# Patient Record
Sex: Male | Born: 1995 | Race: White | Hispanic: No | Smoking: Former smoker
Health system: Southern US, Community
[De-identification: ages and names within clinical notes are randomized; demographics above are authoritative.]

## PROBLEM LIST (undated history)

## (undated) DIAGNOSIS — I10 Essential (primary) hypertension: Secondary | ICD-10-CM

## (undated) DIAGNOSIS — M1990 Unspecified osteoarthritis, unspecified site: Secondary | ICD-10-CM

## (undated) DIAGNOSIS — F4310 Post-traumatic stress disorder, unspecified: Secondary | ICD-10-CM

## (undated) DIAGNOSIS — F319 Bipolar disorder, unspecified: Secondary | ICD-10-CM

## (undated) DIAGNOSIS — J45909 Unspecified asthma, uncomplicated: Secondary | ICD-10-CM

## (undated) HISTORY — DX: Post-traumatic stress disorder, unspecified: F43.10

## (undated) HISTORY — DX: Essential (primary) hypertension: I10

---

## 2020-02-13 ENCOUNTER — Encounter (HOSPITAL_COMMUNITY): Payer: Self-pay | Admitting: Emergency Medicine

## 2020-02-13 ENCOUNTER — Ambulatory Visit (HOSPITAL_COMMUNITY)
Admission: EM | Admit: 2020-02-13 | Discharge: 2020-02-13 | Disposition: A | Discharge status: Home / Self Care | Payer: HRSA Program | Attending: Physician Assistant | Admitting: Physician Assistant

## 2020-02-13 ENCOUNTER — Other Ambulatory Visit: Payer: Self-pay

## 2020-02-13 DIAGNOSIS — F17200 Nicotine dependence, unspecified, uncomplicated: Secondary | ICD-10-CM | POA: Insufficient documentation

## 2020-02-13 DIAGNOSIS — Z20822 Contact with and (suspected) exposure to covid-19: Secondary | ICD-10-CM | POA: Insufficient documentation

## 2020-02-13 DIAGNOSIS — J45901 Unspecified asthma with (acute) exacerbation: Secondary | ICD-10-CM | POA: Insufficient documentation

## 2020-02-13 DIAGNOSIS — Z79899 Other long term (current) drug therapy: Secondary | ICD-10-CM | POA: Insufficient documentation

## 2020-02-13 DIAGNOSIS — Z7951 Long term (current) use of inhaled steroids: Secondary | ICD-10-CM | POA: Diagnosis not present

## 2020-02-13 HISTORY — DX: Unspecified asthma, uncomplicated: J45.909

## 2020-02-13 MED ORDER — BUDESONIDE-FORMOTEROL FUMARATE 80-4.5 MCG/ACT IN AERO: 2.0000 | INHALATION_SPRAY | Freq: Every day | RESPIRATORY_TRACT | 2 refills | Status: DC

## 2020-02-13 MED ORDER — ALBUTEROL SULFATE HFA 108 (90 BASE) MCG/ACT IN AERS: 2.0000 | INHALATION_SPRAY | Freq: Four times a day (QID) | RESPIRATORY_TRACT | 2 refills | Status: DC | PRN

## 2020-02-13 NOTE — ED Triage Notes (Signed)
Pt presents with shortness of breath and chest tightness and headache. States has hx of asthma. States recently moved to [facility] and does not know where inhalers are.

## 2020-02-13 NOTE — Discharge Instructions (Signed)
Return if any problems.

## 2020-02-13 NOTE — ED Provider Notes (Signed)
[facility]    CSN: [id number] Arrival date & time: 02/13/20  1548      History   Chief Complaint Chief Complaint  Patient presents with  . Asthma  . Shortness of Breath    HPI Steven Diaz is a 25 y.o. male.   Pt reports he recenlty moved here and can not find his inhalers. Pt is on a red one and albuterol   The history is provided by the patient. No language interpreter was used.  Asthma This is a new problem. The current episode started 2 days ago. Associated symptoms include shortness of breath. Nothing relieves the symptoms. He has tried nothing for the symptoms. The treatment provided no relief.  Shortness of Breath   Past Medical History:  Diagnosis Date  . Asthma     There are no problems to display for this patient.   History reviewed. No pertinent surgical history.     Home Medications    Prior to Admission medications   Medication Sig Start Date End Date Taking? Authorizing Provider  albuterol (VENTOLIN HFA) 108 (90 Base) MCG/ACT inhaler Inhale 2 puffs into the lungs every 6 (six) hours as needed for wheezing or shortness of breath. 02/13/20  Yes Cheron Schaumann K, PA-C  budesonide-formoterol (SYMBICORT) 80-4.5 MCG/ACT inhaler Inhale 2 puffs into the lungs daily for 1 dose. 02/13/20 02/14/20 Yes Elson Areas, PA-C    Family History History reviewed. No pertinent family history.  Social History Social History   Tobacco Use  . Smoking status: Current Every Day Smoker  . Smokeless tobacco: Never Used  Substance Use Topics  . Alcohol use: Not Currently  . Drug use: Never     Allergies   Patient has no allergy information on record.   Review of Systems Review of Systems  Respiratory: Positive for shortness of breath.   All other systems reviewed and are negative.    Physical Exam Triage Vital Signs ED Triage Vitals  Enc Vitals Group     BP 02/13/20 1736 126/72     Pulse Rate 02/13/20 1736 (!) 104     Resp 02/13/20 1736 18      Temp 02/13/20 1736 98.6 F (37 C)     Temp Source 02/13/20 1736 Oral     SpO2 02/13/20 1736 98 %     Weight --      Height --      Head Circumference --      Peak Flow --      Pain Score 02/13/20 1730 7     Pain Loc --      Pain Edu? --      Excl. in GC? --    No data found.  Updated Vital Signs BP 126/72 (BP Location: Left Arm)   Pulse (!) 104   Temp 98.6 F (37 C) (Oral)   Resp 18   SpO2 98%   Visual Acuity Right Eye Distance:   Left Eye Distance:   Bilateral Distance:    Right Eye Near:   Left Eye Near:    Bilateral Near:     Physical Exam Vitals and nursing note reviewed.  Constitutional:      Appearance: He is well-developed and well-nourished.  HENT:     Head: Normocephalic.  Eyes:     Extraocular Movements: EOM normal.  Cardiovascular:     Rate and Rhythm: Regular rhythm.  Pulmonary:     Effort: Pulmonary effort is normal.     Breath sounds: No  decreased breath sounds.  Abdominal:     General: There is no distension.  Musculoskeletal:        General: Normal range of motion.     Cervical back: Normal range of motion.  Neurological:     Mental Status: He is alert and oriented to person, place, and time.  Psychiatric:        Mood and Affect: Mood and affect normal.      UC Treatments / Results  Labs (all labs ordered are listed, but only abnormal results are displayed) Labs Reviewed - No data to display  EKG   Radiology No results found.  Procedures Procedures (including critical care time)  Medications Ordered in UC Medications - No data to display  Initial Impression / Assessment and Plan / UC Course  I have reviewed the triage vital signs and the nursing notes.  Pertinent labs & imaging results that were available during my care of the patient were reviewed by me and considered in my medical decision making (see chart for details).     MDM:  Pt given rx for inhalers  Final Clinical Impressions(s) / UC Diagnoses   Final  diagnoses:  Mild asthma with exacerbation, unspecified whether persistent     Discharge Instructions     Return if any problems.    ED Prescriptions    Medication Sig Dispense Auth. Provider   albuterol (VENTOLIN HFA) 108 (90 Base) MCG/ACT inhaler Inhale 2 puffs into the lungs every 6 (six) hours as needed for wheezing or shortness of breath. 8 g Torrence Hammack K, PA-C   budesonide-formoterol [facility]) 80-4.5 MCG/ACT inhaler Inhale 2 puffs into the lungs daily for 1 dose. 1 each Elson Areas, PA-C     PDMP not reviewed this encounter.  An After Visit Summary was printed and given to the patient.    Elson Areas, [place] 02/13/20 1815

## 2020-02-14 LAB — SARS CORONAVIRUS 2 (TAT 6-24 HRS): SARS Coronavirus 2: NEGATIVE

## 2020-03-08 ENCOUNTER — Other Ambulatory Visit: Payer: Self-pay

## 2020-03-08 ENCOUNTER — Ambulatory Visit (INDEPENDENT_AMBULATORY_CARE_PROVIDER_SITE_OTHER): Payer: No Payment, Other | Admitting: Professional

## 2020-03-08 DIAGNOSIS — F332 Major depressive disorder, recurrent severe without psychotic features: Secondary | ICD-10-CM | POA: Diagnosis not present

## 2020-03-08 DIAGNOSIS — F4310 Post-traumatic stress disorder, unspecified: Secondary | ICD-10-CM | POA: Diagnosis not present
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
## 2020-03-31 ENCOUNTER — Other Ambulatory Visit: Payer: Self-pay

## 2020-03-31 ENCOUNTER — Telehealth (HOSPITAL_COMMUNITY): Payer: Self-pay | Admitting: Professional

## 2020-03-31 ENCOUNTER — Ambulatory Visit (HOSPITAL_COMMUNITY): Payer: No Payment, Other | Admitting: Professional

## 2020-03-31 NOTE — Telephone Encounter (Signed)
See call log 

## 2020-04-11 ENCOUNTER — Other Ambulatory Visit: Payer: Self-pay

## 2020-04-11 ENCOUNTER — Telehealth (INDEPENDENT_AMBULATORY_CARE_PROVIDER_SITE_OTHER): Payer: No Payment, Other | Admitting: Psychiatry

## 2020-04-11 ENCOUNTER — Encounter (HOSPITAL_COMMUNITY): Payer: Self-pay

## 2020-04-11 DIAGNOSIS — F332 Major depressive disorder, recurrent severe without psychotic features: Secondary | ICD-10-CM | POA: Diagnosis not present

## 2020-04-11 MED ORDER — HYDROXYZINE HCL 10 MG PO TABS: 10.0000 mg | ORAL_TABLET | Freq: Four times a day (QID) | ORAL | 0 refills | Status: DC | PRN

## 2020-04-11 MED ORDER — FLUOXETINE HCL 20 MG PO CAPS: 20.0000 mg | ORAL_CAPSULE | Freq: Every day | ORAL | 2 refills | Status: DC
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
## 2020-04-12 ENCOUNTER — Telehealth (HOSPITAL_COMMUNITY): Payer: No Payment, Other

## 2020-04-26 ENCOUNTER — Ambulatory Visit (INDEPENDENT_AMBULATORY_CARE_PROVIDER_SITE_OTHER): Payer: No Payment, Other | Admitting: Professional

## 2020-04-26 ENCOUNTER — Other Ambulatory Visit: Payer: Self-pay

## 2020-04-26 DIAGNOSIS — F332 Major depressive disorder, recurrent severe without psychotic features: Secondary | ICD-10-CM

## 2020-04-26 DIAGNOSIS — F4310 Post-traumatic stress disorder, unspecified: Secondary | ICD-10-CM

## 2020-04-26 NOTE — Progress Notes (Signed)
Virtual Visit via Video Note  I connected with Steven Diaz on 04/26/20 at 11:00 AM EDT by a video enabled telemedicine application and verified that I am speaking with the correct person using two identifiers.  Location: Patient: Home Provider: Clinical Home Office   I discussed the limitations of evaluation and management by telemedicine and the availability of in person appointments. The patient expressed understanding and agreed to proceed.  Follow Up Instructions:    I discussed the assessment and treatment plan with the patient. The patient was provided an opportunity to ask questions and all were answered. The patient agreed with the plan and demonstrated an understanding of the instructions.   The patient was advised to call back or seek an in-person evaluation if the symptoms worsen or if the condition fails to improve as anticipated.  I provided 38 minutes of non-face-to-face time during this encounter.   Quinn Axe, [facility]    THERAPIST PROGRESS NOTE  Session Time: 11a  Participation Level: Active  Behavioral Response: CasualAlertAnxious  Type of Therapy: Individual Therapy  Treatment Goals addressed: Coping  Interventions: CBT, Solution Focused, Strength-based, Supportive and Reframing  Summary: Steven Diaz is a 25 y.o. male who presents with depression, anxiety, and PTSD symptoms.  Pt reports "I'm trying to be day to day." Pt reports this helps him not get overwhelmed. Pt states the medications seem to be helping, but is concerned about weight gain. Cln and pt spend time discussing thoughts related to weight gain and not catastrophizing. Pt reports he is struggling financially to cover bills. Pt reports being overwhelmed by things he has put off. Cln and pt spend time discussing making lists and prioritizing that list. Pt reports going to a [employer] in [place], [place] on Thursday and he is hoping this will help with socialization and feeling more normal  since COVID. Pt reports some anxiety about getting COVID at the convention. Cln and pt discuss thoughts related to COVID and the cognitive distortions related to those thoughts. Cln and pt discuss control and focusing on what pt can do to control anxiety. Pt reports work has been very stressful and there are not enough people working. Cln and pt spend time discussing pts process of quitting smoking, soda, and vaping. Pt reports he is trying to eat healthier and manage weight.  Pt denies SI/HI/AVH.  Suicidal/Homicidal: Nowithout intent/plan  Therapist Response: Cln asked how the client has been since last seen. Cln asked open ended questions about positive and/or negative changes that have occurred since last seen. Cln used active listening to understand and validate patient. Cln used CBT to discuss cognitive distortions and reframing. Cln assisted with scheduling next appointment.  Plan: Return again in 2 weeks.  Diagnosis: MDD; PTSD    Quinn Axe, [facility] 04/26/2020

## 2020-05-05 ENCOUNTER — Emergency Department (HOSPITAL_COMMUNITY)
Admission: EM | Admit: 2020-05-05 | Discharge: 2020-05-05 | Disposition: A | Discharge status: Home / Self Care | Payer: Self-pay | Attending: Emergency Medicine | Admitting: Emergency Medicine

## 2020-05-05 ENCOUNTER — Other Ambulatory Visit: Payer: Self-pay

## 2020-05-05 ENCOUNTER — Encounter (HOSPITAL_COMMUNITY): Payer: Self-pay | Admitting: Emergency Medicine

## 2020-05-05 ENCOUNTER — Emergency Department (HOSPITAL_COMMUNITY): Payer: Self-pay

## 2020-05-05 DIAGNOSIS — Z7951 Long term (current) use of inhaled steroids: Secondary | ICD-10-CM | POA: Insufficient documentation

## 2020-05-05 DIAGNOSIS — I10 Essential (primary) hypertension: Secondary | ICD-10-CM | POA: Insufficient documentation

## 2020-05-05 DIAGNOSIS — J45909 Unspecified asthma, uncomplicated: Secondary | ICD-10-CM | POA: Insufficient documentation

## 2020-05-05 DIAGNOSIS — T43205A Adverse effect of unspecified antidepressants, initial encounter: Secondary | ICD-10-CM | POA: Insufficient documentation

## 2020-05-05 DIAGNOSIS — Z87891 Personal history of nicotine dependence: Secondary | ICD-10-CM | POA: Insufficient documentation

## 2020-05-05 DIAGNOSIS — T50905A Adverse effect of unspecified drugs, medicaments and biological substances, initial encounter: Secondary | ICD-10-CM

## 2020-05-05 DIAGNOSIS — X58XXXA Exposure to other specified factors, initial encounter: Secondary | ICD-10-CM | POA: Insufficient documentation

## 2020-05-05 LAB — I-STAT CHEM 8, ED
BUN: 10 mg/dL (ref 6–20)
Calcium, Ion: 1.22 mmol/L (ref 1.15–1.40)
Chloride: 107 mmol/L (ref 98–111)
Creatinine, Ser: 0.7 mg/dL (ref 0.61–1.24)
Glucose, Bld: 99 mg/dL (ref 70–99)
HCT: 44 % (ref 39.0–52.0)
Hemoglobin: 15 g/dL (ref 13.0–17.0)
Potassium: 4.2 mmol/L (ref 3.5–5.1)
Sodium: 141 mmol/L (ref 135–145)
TCO2: 23 mmol/L (ref 22–32)

## 2020-05-05 NOTE — Discharge Instructions (Addendum)
Stop taking the decongestant medication.  Follow-up with your primary care doctor to have your blood pressure rechecked.  Return to the ED for any worsening or recurrent symptoms

## 2020-05-05 NOTE — ED Triage Notes (Signed)
Per pt, states he just started taking Prozac about 3 weeks ago-states he took an OTC decongestant and now he feels like his heart is racing and his chest is heavy

## 2020-05-05 NOTE — ED Provider Notes (Signed)
[facility]-EMERGENCY DEPT Provider Note   CSN: [id number] Arrival date & time: 05/05/20  1734     History Chief Complaint  Patient presents with  . possible med reaction    Steven Diaz is a 25 y.o. male.  HPI   Patient recently started taking Prozac about 3 weeks ago.  Patient took an over-the-counter decongestant medication and after that started to feel like his heart was racing and his chest was heavy.  Patient feels like the symptoms are improving but they have not completely resolved.  He is not having any shortness of breath.  He denies any leg swelling.  Denies any history of heart or lung disease.  Past Medical History:  Diagnosis Date  . Asthma     Patient Active Problem List   Diagnosis Date Noted  . Posttraumatic stress disorder 03/08/2020  . Major depressive disorder, recurrent episode, severe (HCC) 03/08/2020    History reviewed. No pertinent surgical history.     No family history on file.  Social History   Tobacco Use  . Smoking status: Former Smoker    Quit date: 02/12/2020    Years since quitting: 0.2  . Smokeless tobacco: Never Used  . Tobacco comment: quit two months ago  Substance Use Topics  . Alcohol use: Not Currently  . Drug use: Never    Home Medications Prior to Admission medications   Medication Sig Start Date End Date Taking? Authorizing Provider  albuterol (VENTOLIN HFA) 108 (90 Base) MCG/ACT inhaler Inhale 2 puffs into the lungs every 6 (six) hours as needed for wheezing or shortness of breath. 02/13/20   Elson Areas, PA-C  budesonide-formoterol (SYMBICORT) 80-4.5 MCG/ACT inhaler Inhale 2 puffs into the lungs daily for 1 dose. 02/13/20 02/14/20  Elson Areas, PA-C  FLUoxetine (PROZAC) 20 MG capsule Take 1 capsule (20 mg total) by mouth daily. 04/11/20 04/11/21  Charm Rings, NP  hydrOXYzine (ATARAX/VISTARIL) 10 MG tablet Take 1 tablet (10 mg total) by mouth 4 (four) times daily as needed for anxiety. Take  one tablet as needed during the day as needed for anxiety (up to 3 times) and three tablets at bedtime as needed for sleep 04/11/20   Charm Rings, NP    Allergies    Patient has no allergy information on record.  Review of Systems   Review of Systems  All other systems reviewed and are negative.   Physical Exam Updated Vital Signs BP (!) 153/121   Pulse 86   Temp 98.3 F (36.8 C) (Oral)   Resp 18   Ht 1.905 m (6\' 3" )   Wt (!) 172.4 kg   SpO2 97%   BMI 47.50 kg/m   Physical Exam Vitals and nursing note reviewed.  Constitutional:      General: He is not in acute distress.    Appearance: He is well-developed.     Comments: BMI of 47.5  HENT:     Head: Normocephalic and atraumatic.     Right Ear: External ear normal.     Left Ear: External ear normal.  Eyes:     General: No scleral icterus.       Right eye: No discharge.        Left eye: No discharge.     Conjunctiva/sclera: Conjunctivae normal.  Neck:     Trachea: No tracheal deviation.     Comments: No thyromegaly Cardiovascular:     Rate and Rhythm: Normal rate and regular rhythm.  Pulmonary:  Effort: Pulmonary effort is normal. No respiratory distress.     Breath sounds: Normal breath sounds. No stridor. No wheezing or rales.  Abdominal:     General: Bowel sounds are normal. There is no distension.     Palpations: Abdomen is soft.     Tenderness: There is no abdominal tenderness. There is no guarding or rebound.  Musculoskeletal:        General: No tenderness.     Cervical back: Neck supple.  Skin:    General: Skin is warm and dry.     Findings: No rash.  Neurological:     Mental Status: He is alert.     Cranial Nerves: No cranial nerve deficit (no facial droop, extraocular movements intact, no slurred speech).     Sensory: No sensory deficit.     Motor: No abnormal muscle tone or seizure activity.     Coordination: Coordination normal.     ED Results / Procedures / Treatments   Labs (all labs  ordered are listed, but only abnormal results are displayed) Labs Reviewed  I-STAT CHEM 8, ED    EKG EKG Interpretation  Date/Time:  Thursday May 05 2020 17:54:35 EDT Ventricular Rate:  108 PR Interval:  167 QRS Duration: 104 QT Interval:  313 QTC Calculation: 420 R Axis:   83 Text Interpretation: Sinus tachycardia Low voltage, precordial leads No old tracing to compare Confirmed by Meridee Score [phone]) on 05/05/2020 6:00:24 PM   Radiology DG Chest 2 View  Result Date: 05/05/2020 CLINICAL DATA:  Chest discomfort EXAM: CHEST - 2 VIEW COMPARISON:  None. FINDINGS: The heart size and mediastinal contours are within normal limits. Both lungs are clear. The visualized skeletal structures are unremarkable. IMPRESSION: No active cardiopulmonary disease. Electronically Signed   By: Jasmine Pang M.D.   On: 05/05/2020 20:21    Procedures Procedures   Medications Ordered in ED Medications - No data to display  ED Course  I have reviewed the triage vital signs and the nursing notes.  Pertinent labs & imaging results that were available during my care of the patient were reviewed by me and considered in my medical decision making (see chart for details).    MDM Rules/Calculators/A&P                          Patient presented to the ED for evaluation of possible adverse drug reaction.  Patient states initially started taking Prozac few weeks ago.  Patient took decongestants recently and felt his heart was beating rapidly and his blood pressure was elevated.  In the ED patient was noted to be initially tachycardic but that has returned to normal and his heart rate is now 86.  Patient has had elevated blood pressures.  EKG and laboratory tests are unremarkable.  Certainly possibilities having hypertension given an interaction from the decongestant medication.  I will have him stop taking that medication.  Recommend following up with his primary care doctor to recheck his blood pressure.   If he continues to have elevated blood pressures at this level he may need to start blood pressure medications  But no emergent indication for treatment at this time. Final Clinical Impression(s) / ED Diagnoses Final diagnoses:  Adverse effect of drug, initial encounter  Hypertension, unspecified type    Rx / DC Orders ED Discharge Orders    None       Linwood Dibbles, MD 05/05/20 2111

## 2020-05-11 ENCOUNTER — Telehealth (HOSPITAL_COMMUNITY): Payer: Self-pay | Admitting: Professional

## 2020-05-11 ENCOUNTER — Ambulatory Visit (HOSPITAL_COMMUNITY): Payer: No Payment, Other | Admitting: Professional

## 2020-05-11 ENCOUNTER — Other Ambulatory Visit: Payer: Self-pay

## 2020-05-11 NOTE — Telephone Encounter (Signed)
See call log 

## 2020-05-16 ENCOUNTER — Encounter (HOSPITAL_COMMUNITY): Payer: Self-pay

## 2020-05-16 ENCOUNTER — Other Ambulatory Visit: Payer: Self-pay

## 2020-05-16 ENCOUNTER — Telehealth (INDEPENDENT_AMBULATORY_CARE_PROVIDER_SITE_OTHER): Payer: No Payment, Other | Admitting: Psychiatry

## 2020-05-16 DIAGNOSIS — F4310 Post-traumatic stress disorder, unspecified: Secondary | ICD-10-CM | POA: Diagnosis not present

## 2020-05-16 DIAGNOSIS — F331 Major depressive disorder, recurrent, moderate: Secondary | ICD-10-CM | POA: Diagnosis not present

## 2020-05-16 MED ORDER — FLUOXETINE HCL 20 MG PO CAPS: 20.0000 mg | ORAL_CAPSULE | Freq: Every day | ORAL | 2 refills | Status: DC

## 2020-05-16 MED ORDER — GABAPENTIN 100 MG PO CAPS
100.0000 mg | ORAL_CAPSULE | Freq: Three times a day (TID) | ORAL | 2 refills | Status: DC
Start: 2020-05-16 — End: 2020-06-13
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
## 2020-05-26 ENCOUNTER — Ambulatory Visit (HOSPITAL_COMMUNITY): Payer: No Payment, Other | Admitting: Professional

## 2020-05-26 ENCOUNTER — Telehealth (HOSPITAL_COMMUNITY): Payer: Self-pay | Admitting: Professional

## 2020-05-26 ENCOUNTER — Other Ambulatory Visit: Payer: Self-pay

## 2020-05-26 NOTE — Telephone Encounter (Signed)
See call log 

## 2020-06-13 ENCOUNTER — Encounter (HOSPITAL_COMMUNITY): Payer: Self-pay

## 2020-06-13 ENCOUNTER — Other Ambulatory Visit: Payer: Self-pay

## 2020-06-13 ENCOUNTER — Telehealth (INDEPENDENT_AMBULATORY_CARE_PROVIDER_SITE_OTHER): Payer: No Payment, Other | Admitting: Psychiatry

## 2020-06-13 DIAGNOSIS — F330 Major depressive disorder, recurrent, mild: Secondary | ICD-10-CM | POA: Diagnosis not present

## 2020-06-13 DIAGNOSIS — F331 Major depressive disorder, recurrent, moderate: Secondary | ICD-10-CM | POA: Diagnosis not present

## 2020-06-13 MED ORDER — GABAPENTIN 100 MG PO CAPS: 100.0000 mg | ORAL_CAPSULE | Freq: Three times a day (TID) | ORAL | 2 refills | Status: DC

## 2020-06-13 MED ORDER — FLUOXETINE HCL 20 MG PO CAPS: 20.0000 mg | ORAL_CAPSULE | Freq: Every day | ORAL | 2 refills | Status: DC
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
## 2020-08-15 ENCOUNTER — Other Ambulatory Visit: Payer: Self-pay

## 2020-08-15 ENCOUNTER — Telehealth (HOSPITAL_COMMUNITY): Payer: No Payment, Other

## 2020-09-26 ENCOUNTER — Other Ambulatory Visit: Payer: Self-pay

## 2020-09-26 ENCOUNTER — Emergency Department (HOSPITAL_COMMUNITY)
Admission: EM | Admit: 2020-09-26 | Discharge: 2020-09-26 | Disposition: A | Discharge status: Home / Self Care | Payer: Self-pay | Attending: Emergency Medicine | Admitting: Emergency Medicine

## 2020-09-26 ENCOUNTER — Encounter (HOSPITAL_COMMUNITY): Payer: Self-pay

## 2020-09-26 DIAGNOSIS — J45909 Unspecified asthma, uncomplicated: Secondary | ICD-10-CM | POA: Insufficient documentation

## 2020-09-26 DIAGNOSIS — Z87891 Personal history of nicotine dependence: Secondary | ICD-10-CM | POA: Insufficient documentation

## 2020-09-26 DIAGNOSIS — M545 Low back pain, unspecified: Secondary | ICD-10-CM | POA: Insufficient documentation

## 2020-09-26 MED ORDER — TIZANIDINE HCL 4 MG PO TABS: 4.0000 mg | ORAL_TABLET | Freq: Four times a day (QID) | ORAL | 0 refills | Status: DC | PRN

## 2020-09-26 MED ORDER — NAPROXEN 500 MG PO TABS: 500.0000 mg | ORAL_TABLET | Freq: Two times a day (BID) | ORAL | 0 refills | Status: AC

## 2020-09-26 MED ORDER — KETOROLAC TROMETHAMINE 60 MG/2ML IM SOLN
30.0000 mg | Freq: Once | INTRAMUSCULAR | Status: AC
  Administered 2020-09-26: 30 mg via INTRAMUSCULAR
  Filled 2020-09-26: qty 2

## 2020-09-26 MED ORDER — OXYCODONE-ACETAMINOPHEN 10-325 MG PO TABS: 1.0000 | ORAL_TABLET | Freq: Four times a day (QID) | ORAL | 0 refills | Status: DC | PRN

## 2020-09-26 NOTE — ED Provider Notes (Signed)
[facility] Provider Note   CSN: [id number] Arrival date & time: 09/26/20  1732     History Chief Complaint  Patient presents with   Back Pain    Steven Diaz is a 25 y.o. male.  Patient presents with worsening of his lower back pain.  He states that he has chronic lower back pain comes and goes for several years.  He was moving some furniture about a week ago and felt a popping sensation in his lower back and has had worsening back pain for the past several days now.  Denies fall or other trauma.  Denies any new numbness or weakness.  Denies any bowel or bladder dysfunction denies any unintentional weight loss.  Has been taking Tylenol Motrin at home without improvement of his pain and presents to the ER.  Denies any history of IV drug use.      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 No pertinent surgical history.     History reviewed. No pertinent family history.  Social History   Tobacco Use   Smoking status: Former    Types: Cigarettes    Quit date: 02/12/2020    Years since quitting: 0.6   Smokeless tobacco: Never   Tobacco comments:    quit two months ago  Substance Use Topics   Alcohol use: Not Currently   Drug use: Never    Home Medications Prior to Admission medications   Medication Sig Start Date End Date Taking? Authorizing Provider  albuterol (VENTOLIN HFA) 108 (90 Base) MCG/ACT inhaler Inhale 2 puffs into the lungs every 6 (six) hours as needed for wheezing or shortness of breath. 02/13/20   Elson Areas, PA-C  budesonide-formoterol (SYMBICORT) 80-4.5 MCG/ACT inhaler Inhale 2 puffs into the lungs daily for 1 dose. 02/13/20 02/14/20  Elson Areas, PA-C  FLUoxetine (PROZAC) 20 MG capsule Take 1 capsule (20 mg total)  by mouth daily. 06/13/20 06/13/21  Charm Rings, NP  gabapentin (NEURONTIN) 100 MG capsule Take 1 capsule (100 mg total) by mouth 3 (three) times daily. 06/13/20 06/13/21  Charm Rings, NP  naproxen (NAPROSYN) 500 MG tablet Take 1 tablet (500 mg total) by mouth 2 (two) times daily with a meal for 7 days. 09/26/20 10/03/20 Yes Cheryll Cockayne, MD  oxyCODONE-acetaminophen (PERCOCET) 10-325 MG tablet Take 1 tablet by mouth every 6 (six) hours as needed for up to 12 doses for pain. 09/26/20  Yes Jourden Gilson, Eustace Moore, MD  tiZANidine (ZANAFLEX) 4 MG tablet Take 1 tablet (4 mg total) by mouth every 6 (six) hours as needed for up to 15 doses for muscle spasms. 09/26/20  Yes Cheryll Cockayne, MD    Allergies    Patient has no known allergies.  Review of Systems   Review of Systems  Constitutional:  Negative for fever.  HENT:  Negative for ear pain and sore throat.   Eyes:  Negative for pain.  Respiratory:  Negative for cough.   Cardiovascular:  Negative for chest pain.  Gastrointestinal:  Negative for abdominal pain.  Genitourinary:  Negative for flank pain.  Musculoskeletal:  Positive for back pain.  Skin:  Negative for color change and rash.  Neurological:  Negative for syncope.  All other systems reviewed and are negative.  Physical  Exam Updated Vital Signs BP (!) 150/96   Pulse 99   Temp 98.5 F (36.9 C)   Resp 20   Ht 6\' 4"  (1.93 m)   Wt (!) 172.4 kg   SpO2 97%   BMI 46.26 kg/m   Physical Exam Constitutional:      Appearance: He is well-developed.  HENT:     Head: Normocephalic.     Nose: Nose normal.  Eyes:     Extraocular Movements: Extraocular movements intact.  Cardiovascular:     Rate and Rhythm: Normal rate.  Pulmonary:     Effort: Pulmonary effort is normal.  Musculoskeletal:     Comments: CRT midline tenderness noted.  L3-4 bilateral paraspinal tenderness is present.  However the patient is able to ambulate with minimal discomfort mildly antalgic gait is present.  He is able  to turn his torso left and right with mild to moderate pain.  Skin:    Coloration: Skin is not jaundiced.  Neurological:     General: No focal deficit present.     Mental Status: He is alert. Mental status is at baseline.     Cranial Nerves: No cranial nerve deficit.     Motor: No weakness.     Gait: Gait normal.     Comments: Normal gait without assistance, mildly antalgic.    ED Results / Procedures / Treatments   Labs (all labs ordered are listed, but only abnormal results are displayed) Labs Reviewed - No data to display  EKG None  Radiology No results found.  Procedures Procedures   Medications Ordered in ED Medications  ketorolac (TORADOL) injection 30 mg (30 mg Intramuscular Given 09/26/20 2043)    ED Course  I have reviewed the triage vital signs and the nursing notes.  Pertinent labs & imaging results that were available during my care of the patient were reviewed by me and considered in my medical decision making (see chart for details).    MDM Rules/Calculators/A&P                           Patient given Toradol here with mild improvement of pain.  Initial temperature was 99.5 however repeat without any intervention appears normal.  Recommend outpatient follow-up with his doctor within the week.  Recommending immediate return for new numbness weakness worsening pain or any additional concerns.  Final Clinical Impression(s) / ED Diagnoses Final diagnoses:  Midline low back pain without sciatica, unspecified chronicity    Rx / DC Orders ED Discharge Orders          Ordered    naproxen (NAPROSYN) 500 MG tablet  2 times daily with meals        09/26/20 2050    oxyCODONE-acetaminophen (PERCOCET) 10-325 MG tablet  Every 6 hours PRN        09/26/20 2050    tiZANidine (ZANAFLEX) 4 MG tablet  Every 6 hours PRN        09/26/20 2050             09/28/20, MD 09/26/20 2050

## 2020-09-26 NOTE — ED Triage Notes (Signed)
Pt reports chronic back pain that became worse after lifting something last week.

## 2020-09-26 NOTE — Discharge Instructions (Addendum)
Call your primary care doctor or specialist as discussed in the next 2-3 days.   Return immediately back to the ER if:  Your symptoms worsen within the next 12-24 hours. You develop new symptoms such as new fevers, persistent vomiting, new pain, shortness of breath, or new weakness or numbness, or if you have any other concerns.  

## 2020-10-04 ENCOUNTER — Other Ambulatory Visit: Payer: Self-pay

## 2020-10-04 ENCOUNTER — Ambulatory Visit
Admission: EM | Admit: 2020-10-04 | Discharge: 2020-10-04 | Disposition: A | Discharge status: Home / Self Care | Payer: Self-pay | Attending: Emergency Medicine | Admitting: Emergency Medicine

## 2020-10-04 ENCOUNTER — Emergency Department (HOSPITAL_COMMUNITY): Admission: EM | Admit: 2020-10-04 | Discharge: 2020-10-04 | Discharge status: Against Medical Advice | Payer: Self-pay

## 2020-10-04 DIAGNOSIS — T8090XA Unspecified complication following infusion and therapeutic injection, initial encounter: Secondary | ICD-10-CM

## 2020-10-04 DIAGNOSIS — L03317 Cellulitis of buttock: Secondary | ICD-10-CM

## 2020-10-04 MED ORDER — DOXYCYCLINE HYCLATE 100 MG PO CAPS: 100.0000 mg | ORAL_CAPSULE | Freq: Two times a day (BID) | ORAL | 0 refills | Status: AC

## 2020-10-04 MED ORDER — IBUPROFEN 600 MG PO TABS: 600.0000 mg | ORAL_TABLET | Freq: Four times a day (QID) | ORAL | 0 refills | Status: DC | PRN

## 2020-10-04 NOTE — ED Triage Notes (Signed)
Pt was seen at the ED on 8/22 for back pain. He was tx with a toradol injection in his low back causing a sudden onset of burning and pain at the injection site. Pt reports that he's had a toradol injection in the past without pain. Three days ago, Pt noticed a "hole" at the injection side with redness that has increased since the onset. Pain is worse when he is lying on the area.  Pt is concerned for a skin infection noting tight skin sensation with a "leather" feel on BLE.

## 2020-10-04 NOTE — Discharge Instructions (Addendum)
Warm or cool compresses, whichever feels better.  Take 600 mg of ibuprofen combined with 1000 mg of Tylenol together 3-4 times a day as needed for pain.  Finish doxycycline unless a medical provider tells you to stop.  I have placed a referral to the wound care center.  Below is a list of primary care practices who are taking new patients for you to follow-up with.  [facility] internal medicine clinic Ground Floor - [facility], [address] [place], [place], [place] [address] [phone]  [facility] Primary Care at [facility] [address] [place], [place] [address] [phone]  [facility] [address] [place], [place] [address] [phone]  [facility] [phone] [address] [place] [address]  [facility]: [address] [place] [address]  [phone]  [facility] Family Medicine: [address] [place] [address]  [phone]  [facility] : [address] [place] [address] [phone]  [facility] Primary Care: [address] [place] [address] [phone]  [facility]: [address] [place] [address] [phone]  Dr. Oneal Grout [address] [facility] [place] [address]  [phone]  Go to www.goodrx.com  or www.costplusdrugs.com to look up your medications. This will give you a list of where you can find your prescriptions at the most affordable prices. Or ask the pharmacist what the cash price is, or if they have any other discount programs available to help make your medication more affordable. This can be less expensive than what you would pay with insurance.
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
## 2020-10-05 ENCOUNTER — Emergency Department (HOSPITAL_COMMUNITY)
Admission: EM | Admit: 2020-10-05 | Discharge: 2020-10-06 | Disposition: A | Discharge status: Home / Self Care | Payer: Self-pay | Attending: Emergency Medicine | Admitting: Emergency Medicine

## 2020-10-05 ENCOUNTER — Other Ambulatory Visit: Payer: Self-pay

## 2020-10-05 ENCOUNTER — Encounter (HOSPITAL_COMMUNITY): Payer: Self-pay

## 2020-10-05 DIAGNOSIS — J45909 Unspecified asthma, uncomplicated: Secondary | ICD-10-CM | POA: Insufficient documentation

## 2020-10-05 DIAGNOSIS — L03818 Cellulitis of other sites: Secondary | ICD-10-CM

## 2020-10-05 DIAGNOSIS — R102 Pelvic and perineal pain: Secondary | ICD-10-CM | POA: Insufficient documentation

## 2020-10-05 DIAGNOSIS — L03317 Cellulitis of buttock: Secondary | ICD-10-CM | POA: Insufficient documentation

## 2020-10-05 DIAGNOSIS — Z7951 Long term (current) use of inhaled steroids: Secondary | ICD-10-CM | POA: Insufficient documentation

## 2020-10-05 DIAGNOSIS — Z87891 Personal history of nicotine dependence: Secondary | ICD-10-CM | POA: Insufficient documentation

## 2020-10-05 NOTE — ED Triage Notes (Signed)
Pt was seen at [facility] ED 8/22 for back pain, states he was given a toradol shot in his back. Pt has a small, inflamed area to his lower back right side that he states is from the shot. Pt wants the wound checked. Pt states the area is painful. Pt states the site has worsened since 8/22.

## 2020-10-06 ENCOUNTER — Emergency Department (HOSPITAL_COMMUNITY): Payer: Self-pay

## 2020-10-06 LAB — COMPREHENSIVE METABOLIC PANEL
ALT: 40 U/L (ref 0–44)
AST: 32 U/L (ref 15–41)
Albumin: 4.3 g/dL (ref 3.5–5.0)
Alkaline Phosphatase: 78 U/L (ref 38–126)
Anion gap: 8 (ref 5–15)
BUN: 9 mg/dL (ref 6–20)
CO2: 26 mmol/L (ref 22–32)
Calcium: 10.2 mg/dL (ref 8.9–10.3)
Chloride: 110 mmol/L (ref 98–111)
Creatinine, Ser: 0.74 mg/dL (ref 0.61–1.24)
GFR, Estimated: >60 mL/min (ref >60)
Glucose, Bld: 102 mg/dL — ABNORMAL HIGH (ref 70–99)
Potassium: 4.1 mmol/L (ref 3.5–5.1)
Sodium: 144 mmol/L (ref 135–145)
Total Bilirubin: 0.3 mg/dL (ref 0.3–1.2)
Total Protein: 7.8 g/dL (ref 6.5–8.1)

## 2020-10-06 LAB — CBC WITH DIFFERENTIAL/PLATELET
Abs Immature Granulocytes: 0.04 10*3/uL (ref 0.00–0.07)
Basophils Absolute: 0 10*3/uL (ref 0.0–0.1)
Basophils Relative: 0 %
Eosinophils Absolute: 0.3 10*3/uL (ref 0.0–0.5)
Eosinophils Relative: 3 %
HCT: 44.3 % (ref 39.0–52.0)
Hemoglobin: 14.1 g/dL (ref 13.0–17.0)
Immature Granulocytes: 0 %
Lymphocytes Relative: 37 %
Lymphs Abs: 4.6 10*3/uL — ABNORMAL HIGH (ref 0.7–4.0)
MCH: 26.3 pg (ref 26.0–34.0)
MCHC: 31.8 g/dL (ref 30.0–36.0)
MCV: 82.6 fL (ref 80.0–100.0)
Monocytes Absolute: 0.8 10*3/uL (ref 0.1–1.0)
Monocytes Relative: 6 %
Neutro Abs: 6.6 10*3/uL (ref 1.7–7.7)
Neutrophils Relative %: 54 %
Platelets: 404 10*3/uL — ABNORMAL HIGH (ref 150–400)
RBC: 5.36 MIL/uL (ref 4.22–5.81)
RDW: 14 % (ref 11.5–15.5)
WBC: 12.3 10*3/uL — ABNORMAL HIGH (ref 4.0–10.5)
nRBC: 0 % (ref 0.0–0.2)

## 2020-10-06 MED ORDER — IOHEXOL 350 MG/ML SOLN
80.0000 mL | Freq: Once | INTRAVENOUS | Status: AC | PRN
  Administered 2020-10-06: 80 mL via INTRAVENOUS

## 2020-10-06 MED ORDER — VANCOMYCIN HCL 2000 MG/400ML IV SOLN
2000.0000 mg | Freq: Once | INTRAVENOUS | Status: AC
  Administered 2020-10-06: 2000 mg via INTRAVENOUS
  Filled 2020-10-06: qty 400

## 2020-10-06 MED ORDER — FENTANYL CITRATE PF 50 MCG/ML IJ SOSY
100.0000 ug | PREFILLED_SYRINGE | Freq: Once | INTRAMUSCULAR | Status: AC
  Administered 2020-10-06: 100 ug via INTRAVENOUS
  Filled 2020-10-06: qty 2

## 2020-10-06 NOTE — Progress Notes (Signed)
A consult was received from an ED physician for Vancomycin per pharmacy dosing.  The patient's profile has been reviewed for ht/wt/allergies/indication/available labs.   A one time order has been placed for Vancomycin 2gm IV.  Further antibiotics/pharmacy consults should be ordered by admitting physician if indicated.                       Thank you, Junita Push PharmD 10/06/2020  4:12 AM

## 2020-10-06 NOTE — ED Provider Notes (Signed)
[facility]ENCY [facility]PT Provider Note   CSN: [id number] Arrival date & time: 10/05/20  2157     History Chief Complaint  Patient presents with   Wound Check    Steven Diaz is a 25 y.o. male.  The history is provided by the patient.  Abscess Location:  Pelvis Pelvic abscess location:  L buttock Abscess quality: induration, painful, redness and warmth   Abscess quality: not draining   Duration:  4 days Pain details:    Quality:  Aching   Severity:  Severe   Timing:  Constant   Progression:  Worsening Chronicity:  New Relieved by:  Nothing Exacerbated by: movement. Associated symptoms: no fever and no vomiting   Patient reports he had a Toradol injection in his left buttocks on August 22.  He reports he had immediate pain at the injection site.  Several days later he noticed redness pain and drainage.  He was seen in urgent care and was given antibiotics but he has not started them as of yet.  He reports the pain is worsening.    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 Authorizing Provider  albuterol (VENTOLIN HFA) 108 (90 Base) MCG/ACT inhaler Inhale 2 puffs into the lungs every 6 (six) hours as needed for wheezing or shortness of breath. 02/13/20   Elson Areas, PA-C  budesonide-formoterol (SYMBICORT) 80-4.5 MCG/ACT inhaler Inhale 2 puffs into the lungs daily for 1 dose. 02/13/20 02/14/20   Elson Areas, PA-C  doxycycline (VIBRAMYCIN) 100 MG capsule Take 1 capsule (100 mg total) by mouth 2 (two) times daily for 10 days. 10/04/20 10/14/20  Domenick Gong, MD  FLUoxetine (PROZAC) 20 MG capsule Take 1 capsule (20 mg total) by mouth daily. 06/13/20 06/13/21  Charm Rings, NP  gabapentin (NEURONTIN) 100 MG capsule Take 1 capsule (100 mg total) by mouth 3 (three) times daily. 06/13/20 06/13/21  Charm Rings, NP  ibuprofen (ADVIL) 600 MG tablet Take 1 tablet (600 mg total) by mouth every 6 (six) hours as needed. 10/04/20   Domenick Gong, MD  oxyCODONE-acetaminophen (PERCOCET) 10-325 MG tablet Take 1 tablet by mouth every 6 (six) hours as needed for up to 12 doses for pain. 09/26/20   Cheryll Cockayne, MD  tiZANidine (ZANAFLEX) 4 MG tablet Take 1 tablet (4 mg total) by mouth every 6 (six) hours as needed for up to 15 doses for muscle spasms. 09/26/20   Cheryll Cockayne, MD    Allergies    Risperidone  Review of Systems   Review of Systems  Constitutional:  Negative for fever.  Gastrointestinal:  Negative for abdominal pain and vomiting.  Skin:  Positive for wound.  All other systems reviewed and are negative.  Physical Exam Updated Vital Signs BP 129/73 (BP Location: Right Arm)   Pulse 76   Temp  98.4 F (36.9 C) (Oral)   Resp 20   Ht 1.93 m (6\' 4" )   Wt (!) 172.4 kg   SpO2 100%   BMI 46.26 kg/m   Physical Exam CONSTITUTIONAL: Well developed/well nourished HEAD: Normocephalic/atraumatic EYES: EOMI ENMT: Mucous membranes moist NECK: supple no meningeal signs SPINE/BACK:entire spine nontender CV: S1/S2 noted, no murmurs/rubs/gallops noted LUNGS: Lungs are clear to auscultation bilaterally, no apparent distress ABDOMEN: soft, obese NEURO: Pt is awake/alert/appropriate, moves all extremitiesx4.  No facial droop.   EXTREMITIES: pulses normal/equal, full ROM SKIN: Wound noted superior to left buttock..  No crepitus.  Diffuse tenderness noted. PSYCH: anxious    ED Results  / Procedures / Treatments   Labs (all labs ordered are listed, but only abnormal results are displayed) Labs Reviewed  COMPREHENSIVE METABOLIC PANEL - Abnormal; Notable for the following components:      Result Value   Glucose, Bld 102 (*)    All other components within normal limits  CBC WITH DIFFERENTIAL/PLATELET - Abnormal; Notable for the following components:   WBC 12.3 (*)    Platelets 404 (*)    Lymphs Abs 4.6 (*)    All other components within normal limits    EKG None  Radiology CT PELVIS W CONTRAST  Result Date: 10/06/2020 CLINICAL DATA:  Inflamed area to lower back around the left sacroiliac joint EXAM: CT PELVIS WITH CONTRAST TECHNIQUE: Multidetector CT imaging of the pelvis was performed using the standard protocol following the bolus administration of intravenous contrast. CONTRAST:  65mL OMNIPAQUE IOHEXOL 350 MG/ML SOLN COMPARISON:  None. FINDINGS: Urinary Tract:  No abnormality visualized. Bowel:  Unremarkable visualized pelvic bowel loops. Vascular/Lymphatic: No pathologically enlarged lymph nodes. No significant vascular abnormality seen. Reproductive:  No mass or other significant abnormality Other:  None. Musculoskeletal: No suspicious bone lesions identified. IMPRESSION: Negative pelvis CT. Electronically Signed   By: 91m M.D.   On: 10/06/2020 04:48    Procedures Procedures   Medications Ordered in ED Medications  vancomycin (VANCOREADY) IVPB 2000 mg/400 mL (2,000 mg Intravenous New Bag/Given 10/06/20 0512)  fentaNYL (SUBLIMAZE) injection 100 mcg (100 mcg Intravenous Given 10/06/20 0414)  iohexol (OMNIPAQUE) 350 MG/ML injection 80 mL (80 mLs Intravenous Contrast Given 10/06/20 0427)    ED Course  I have reviewed the triage vital signs and the nursing notes.  Pertinent labs & imaging results that were available during my care of the patient were reviewed by me and considered in my medical decision making (see chart for details).    MDM  Rules/Calculators/A&P                           Plan to obtain CT imaging of the pelvis region due to worsening pain, redness and reported drainage.  This is at the site of a recent Toradol injection. 5:34 AM Discussed radiology findings with Dr. 12/06/20.  No deep space infection has been identified. Overall patient appears improved. I feel he is appropriate for discharge home.  He has been unable to get his doxycycline filled due to cost.  He plans to get it refilled tomorrow.  He will be given a one-time dose of vancomycin here in the emergency department. We discussed strict ER return precautions Final Clinical Impression(s) / ED Diagnoses Final diagnoses:  Cellulitis of other specified site    Rx / DC Orders ED Discharge Orders     None        Grace Isaac, MD 10/06/20 [phone]

## 2020-12-09 ENCOUNTER — Ambulatory Visit (HOSPITAL_COMMUNITY)
Admission: EM | Admit: 2020-12-09 | Discharge: 2020-12-09 | Disposition: A | Discharge status: Home / Self Care | Payer: Self-pay | Attending: Student | Admitting: Student

## 2020-12-09 ENCOUNTER — Other Ambulatory Visit: Payer: Self-pay

## 2020-12-09 ENCOUNTER — Encounter (HOSPITAL_COMMUNITY): Payer: Self-pay

## 2020-12-09 ENCOUNTER — Ambulatory Visit (INDEPENDENT_AMBULATORY_CARE_PROVIDER_SITE_OTHER): Payer: Self-pay

## 2020-12-09 DIAGNOSIS — S46912A Strain of unspecified muscle, fascia and tendon at shoulder and upper arm level, left arm, initial encounter: Secondary | ICD-10-CM

## 2020-12-09 DIAGNOSIS — M79671 Pain in right foot: Secondary | ICD-10-CM

## 2020-12-09 DIAGNOSIS — M25512 Pain in left shoulder: Secondary | ICD-10-CM

## 2020-12-09 DIAGNOSIS — S9031XA Contusion of right foot, initial encounter: Secondary | ICD-10-CM

## 2020-12-09 NOTE — ED Provider Notes (Signed)
[facility]    CSN: [id number] Arrival date & time: 12/09/20  1434      History   Chief Complaint Chief Complaint  Patient presents with   Foot Injury   Shoulder Pain    HPI Dowell Hoon is a 25 y.o. male presenting with multiple musculoskeletal injuries following bedframe falling on him.  Medical history left rotator cuff injury years ago, patient states this did not require surgery.  He describes trying to move a large bed frame 1 day ago when he lost his footing in the bed frame pushed him into the wall, causing his left shoulder to slam into the wall.  It then fell on the right foot.  Now having significant pain over the dorsum of the right foot and the left shoulder.  Denies sensation changes.  States that he is able to move the left arm but with significant pain.  This is a problem because he has an active job.  Ambulating with pain.  Denies pain or injury elsewhere.  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 No pertinent surgical history.     Home Medications    Prior to Admission medications   Medication Sig Start Date End Date Taking? Authorizing Provider  albuterol (VENTOLIN HFA) 108 (90 Base) MCG/ACT inhaler Inhale 2 puffs into the lungs every 6 (six) hours as needed for wheezing or shortness of breath. 02/13/20   Elson Areas, PA-C  budesonide-formoterol (SYMBICORT) 80-4.5 MCG/ACT inhaler Inhale 2 puffs into the lungs daily for 1 dose. 02/13/20 02/14/20  Elson Areas, PA-C  FLUoxetine (PROZAC) 20 MG capsule Take 1 capsule (20 mg total) by mouth daily. Patient not taking: Reported on 12/09/2020 06/13/20 06/13/21  Charm Rings, NP  gabapentin (NEURONTIN) 100 MG capsule Take 1 capsule (100 mg total) by mouth 3 (three) times daily. 06/13/20 06/13/21  Charm Rings, NP  ibuprofen (ADVIL) 600 MG tablet Take 1 tablet (600 mg total) by mouth every 6 (six) hours as needed. 10/04/20   Domenick Gong, MD  oxyCODONE-acetaminophen (PERCOCET) 10-325 MG tablet Take 1 tablet by mouth every 6 (six) hours as needed for up to 12 doses for pain. 09/26/20   Cheryll Cockayne, MD  tiZANidine (ZANAFLEX) 4 MG tablet Take 1 tablet (4 mg total) by mouth every 6 (six) hours as needed for up to 15 doses for muscle spasms. Patient not taking: Reported on 12/09/2020 09/26/20   Cheryll Cockayne, MD    Family History History reviewed. No pertinent family history.  Social History Social History   Tobacco Use   Smoking status: Former    Types: Cigarettes    Quit date: 02/12/2020    Years since quitting: 0.8   Smokeless tobacco: Never   Tobacco comments:    quit two months ago  Substance Use Topics   Alcohol use: Not Currently   Drug use: Never     Allergies   Risperidone   Review of Systems Review of Systems  Musculoskeletal:        L shoulder pain R foot pain    Physical Exam Triage Vital Signs ED Triage Vitals  Enc Vitals Group     BP 12/09/20 1545 137/66     Pulse Rate 12/09/20 1545 (!) 113  Resp 12/09/20 1545 16     Temp 12/09/20 1545 98.9 F (37.2 C)     Temp Source 12/09/20 1545 Oral     SpO2 12/09/20 1545 96 %     Weight --      Height --      Head Circumference --      Peak Flow --      Pain Score 12/09/20 1547 10     Pain Loc --      Pain Edu? --      Excl. in GC? --    No data found.  Updated Vital Signs BP 137/66 (BP Location: Right Arm)   Pulse (!) 113   Temp 98.9 F (37.2 C) (Oral)   Resp 16   SpO2 96%   Visual Acuity Right Eye Distance:   Left Eye Distance:   Bilateral Distance:    Right Eye Near:   Left Eye Near:    Bilateral Near:     Physical Exam Vitals reviewed.  Constitutional:      General: He is not in acute distress.    Appearance: Normal appearance. He is not ill-appearing or diaphoretic.  HENT:      Head: Normocephalic and atraumatic.  Cardiovascular:     Rate and Rhythm: Normal rate and regular rhythm.     Heart sounds: Normal heart sounds.  Pulmonary:     Effort: Pulmonary effort is normal.     Breath sounds: Normal breath sounds.  Musculoskeletal:     Comments: L shoulder- diffusely TTP without effusion or skin changes. ROM limited due to pain, but abduction adduction is intact. Grip strength 5/5 bilaterally, radial pulse 2+. No midline spinous tenderness or deformity.   R foot- TTP distal dorsal aspect, worse at base of 1st and 2nd metatarsals. No midfoot tenderness. No malleolar tenderness. Ambulating with pain. DP 2+, cap refill <2 seconds .  Skin:    General: Skin is warm.  Neurological:     General: No focal deficit present.     Mental Status: He is alert and oriented to person, place, and time.  Psychiatric:        Mood and Affect: Mood normal.        Behavior: Behavior normal.        Thought Content: Thought content normal.        Judgment: Judgment normal.     UC Treatments / Results  Labs (all labs ordered are listed, but only abnormal results are displayed) Labs Reviewed - No data to display  EKG   Radiology DG Shoulder Left  Result Date: 12/09/2020 CLINICAL DATA:  Acute shoulder pain after falling duct within normal EXAM: LEFT SHOULDER - 2+ VIEW COMPARISON:  None. FINDINGS: There is no evidence of fracture or dislocation. There is no evidence of arthropathy or other focal bone abnormality. Soft tissues are unremarkable. Visualized lung field is clear. IMPRESSION: No acute osseous abnormality. Electronically Signed   By: Maudry Mayhew M.D.   On: 12/09/2020 16:48   DG Foot 2 Views Right  Result Date: 12/09/2020 CLINICAL DATA:  Tender to palpation first and second metatarsals after injury. EXAM: RIGHT FOOT - 2 VIEW COMPARISON:  None. FINDINGS: There is no evidence of fracture or dislocation. There is no evidence of arthropathy or other focal bone  abnormality. Mild soft tissue swelling over the dorsum of the foot. IMPRESSION: No acute osseous abnormality. Mild soft tissue swelling over the dorsum of the foot. Electronically Signed   By: Maudry Mayhew  M.D.   On: 12/09/2020 16:47    Procedures Procedures (including critical care time)  Medications Ordered in UC Medications - No data to display  Initial Impression / Assessment and Plan / UC Course  I have reviewed the triage vital signs and the nursing notes.  Pertinent labs & imaging results that were available during my care of the patient were reviewed by me and considered in my medical decision making (see chart for details).     This patient is a very pleasant 25 y.o. year old male presenting with L shoulder strain and R foot contusion. Neurovascularly intact.,  He does have a distant history of left rotator cuff injury.  X-ray left shoulder-negative. X-ray right foot-negative.  CAM boot, shoulder sling, RICE.  Work note provided.  Follow-up with Ortho, information provided.  ED return precautions discussed. Patient verbalizes understanding and agreement.    Final Clinical Impressions(s) / UC Diagnoses   Final diagnoses:  Strain of left shoulder, initial encounter  Contusion of right foot, initial encounter     Discharge Instructions      -You can take Tylenol up to 1000 mg 3 times daily, and ibuprofen up to 600 mg 3 times daily with food.  You can take these together, or alternate every 3-4 hours. -Boot and sling while pain persists -Follow-up with an orthopedist. I recommend [facility] at [address], [place], [place] [address]. You can schedule an appointment by calling [phone]) or online ([email or web address]), but they also have a walk-in clinic M-F 8a-8p and Sat 10a-3p.    ED Prescriptions   None    PDMP not reviewed this encounter.   Rhys Martini, PA-C 12/09/20 1751

## 2020-12-09 NOTE — ED Triage Notes (Signed)
Pt presents with right foot/toe pain and left shoulder pain after a metal bed frame fell on him when he was moving

## 2020-12-09 NOTE — Discharge Instructions (Signed)
-  You can take Tylenol up to 1000 mg 3 times daily, and ibuprofen up to 600 mg 3 times daily with food.  You can take these together, or alternate every 3-4 hours. -Boot and sling while pain persists -Follow-up with an orthopedist. I recommend [facility] at [address], [place], [place] [address]. You can schedule an appointment by calling [phone]) or online ([email or web address]), but they also have a walk-in clinic M-F 8a-8p and Sat 10a-3p.

## 2020-12-13 ENCOUNTER — Ambulatory Visit
Admission: EM | Admit: 2020-12-13 | Discharge: 2020-12-13 | Disposition: A | Discharge status: Home / Self Care | Payer: Self-pay | Attending: Physician Assistant | Admitting: Physician Assistant

## 2020-12-13 ENCOUNTER — Other Ambulatory Visit: Payer: Self-pay

## 2020-12-13 DIAGNOSIS — M25512 Pain in left shoulder: Secondary | ICD-10-CM

## 2020-12-13 MED ORDER — PREDNISONE 20 MG PO TABS: 40.0000 mg | ORAL_TABLET | Freq: Every day | ORAL | 0 refills | Status: AC

## 2020-12-13 NOTE — ED Triage Notes (Signed)
Pt c/o left shoulder injury that occurred when moving furniture.   States initially went to [facility] who sent pt to emergency ortho. States cannot afford emergency ortho cost. States inbetween work and without insurance at this time and needs some other tx plan that is within the resources available to pt.   Onset last week

## 2020-12-13 NOTE — ED Provider Notes (Signed)
[name]    CSN: [id number] Arrival date & time: 12/13/20  1308      History   Chief Complaint Chief Complaint  Patient presents with   left shoulder pain    HPI Barbara Keng is a 25 y.o. male.   Patient here today for evaluation of continued left shoulder pain.  He reports that he injured his shoulder about 5 days ago when he was moving a bed frame into his new apartment.  He reports that the bed frame fell onto his foot and then fell backwards which pushed him in his shoulder into the wall behind him.  He states he felt a pull in his shoulder at this time.  He does have history of left shoulder injury with partial rotator cuff tear.  This did not require surgical intervention.  He reports that pain has been consistent since injury, and he states he is having difficulty even performing activities of daily living including showering, etc.  He is also not been able to return to work because he cannot use his left arm.  He states he does have some numbness and tingling at times.  Any movement of left shoulder causes pain.  He has tried taking Tylenol and ibuprofen without significant relief.  The history is provided by the patient.   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 No pertinent surgical history.     Home Medications    Prior to Admission medications   Medication Sig Start Date End Date Taking? Authorizing Provider  predniSONE (DELTASONE) 20 MG tablet Take 2 tablets (40 mg total) by mouth daily with breakfast for 5 days. 12/13/20 12/18/20 Yes Tomi Bamberger, PA-C  albuterol (VENTOLIN HFA) 108 (90 Base) MCG/ACT inhaler Inhale 2 puffs into the lungs every 6 (six) hours as needed for wheezing or shortness of breath. 02/13/20   Elson Areas, PA-C   budesonide-formoterol (SYMBICORT) 80-4.5 MCG/ACT inhaler Inhale 2 puffs into the lungs daily for 1 dose. 02/13/20 02/14/20  Elson Areas, PA-C  FLUoxetine (PROZAC) 20 MG capsule Take 1 capsule (20 mg total) by mouth daily. Patient not taking: Reported on 12/09/2020 06/13/20 06/13/21  Charm Rings, NP  gabapentin (NEURONTIN) 100 MG capsule Take 1 capsule (100 mg total) by mouth 3 (three) times daily. 06/13/20 06/13/21  Charm Rings, NP  ibuprofen (ADVIL) 600 MG tablet Take 1 tablet (600 mg total) by mouth every 6 (six) hours as needed. 10/04/20   Domenick Gong, MD  oxyCODONE-acetaminophen (PERCOCET) 10-325 MG tablet Take 1 tablet by mouth every 6 (six) hours as needed for up to 12 doses for pain. 09/26/20   Cheryll Cockayne, MD  tiZANidine (ZANAFLEX) 4 MG tablet Take 1 tablet (4 mg total) by mouth every 6 (six) hours as needed for up to 15 doses for muscle spasms. Patient not taking: Reported on 12/09/2020 09/26/20   Cheryll Cockayne, MD    Family History History reviewed. No pertinent family history.  Social History Social History   Tobacco Use   Smoking status: Former    Types: Cigarettes    Quit date: 02/12/2020    Years since quitting: 0.8   Smokeless tobacco: Never   Tobacco comments:    quit two months ago  Substance Use Topics   Alcohol  use: Not Currently   Drug use: Never     Allergies   Risperidone   Review of Systems Review of Systems  Constitutional:  Negative for chills and fever.  Eyes:  Negative for discharge and redness.  Musculoskeletal:  Positive for arthralgias. Negative for joint swelling.  Skin:  Positive for color change and wound.  Neurological:  Positive for numbness.    Physical Exam Triage Vital Signs ED Triage Vitals  Enc Vitals Group     BP 12/13/20 1421 (!) 146/107     Pulse Rate 12/13/20 1421 93     Resp 12/13/20 1421 18     Temp 12/13/20 1421 97.9 F (36.6 C)     Temp Source 12/13/20 1421 Oral     SpO2 12/13/20 1421 97 %     Weight --       Height --      Head Circumference --      Peak Flow --      Pain Score 12/13/20 1422 0     Pain Loc --      Pain Edu? --      Excl. in GC? --    No data found.  Updated Vital Signs BP (!) 146/107 (BP Location: Right Arm)   Pulse 93   Temp 97.9 F (36.6 C) (Oral)   Resp 18   SpO2 97%       Physical Exam Vitals and nursing note reviewed.  Constitutional:      General: He is not in acute distress.    Appearance: Normal appearance. He is not ill-appearing.  HENT:     Head: Normocephalic and atraumatic.  Eyes:     Conjunctiva/sclera: Conjunctivae normal.  Cardiovascular:     Rate and Rhythm: Normal rate.  Pulmonary:     Effort: Pulmonary effort is normal.  Musculoskeletal:     Comments: Decreased range of motion of the left shoulder in all directions due to pain.  Neurological:     Mental Status: He is alert.  Psychiatric:        Mood and Affect: Mood normal.        Behavior: Behavior normal.        Thought Content: Thought content normal.     UC Treatments / Results  Labs (all labs ordered are listed, but only abnormal results are displayed) Labs Reviewed - No data to display  EKG   Radiology No results found.  Procedures Procedures (including critical care time)  Medications Ordered in UC Medications - No data to display  Initial Impression / Assessment and Plan / UC Course  I have reviewed the triage vital signs and the nursing notes.  Pertinent labs & imaging results that were available during my care of the patient were reviewed by me and considered in my medical decision making (see chart for details).  Patient was provided information for [facility] at last urgent care visit as it is very likely patient will need Ortho follow-up.  Patient was unable to afford the co-pay given lack of insurance, so contact information given to other orthopedic offices in the local area.  We will trial prednisone for hopeful improvement of inflammation in the area  and there is an improvement of pain.  Encouraged follow-up with any further concerns but ultimately explained that patient would need to see a specialist for further evaluation as x-ray was normal 4 days ago and no other imaging is available through urgent care.  Final Clinical Impressions(s) / UC Diagnoses  Final diagnoses:  Acute pain of left shoulder   Discharge Instructions   None    ED Prescriptions     Medication Sig Dispense Auth. Provider   predniSONE (DELTASONE) 20 MG tablet Take 2 tablets (40 mg total) by mouth daily with breakfast for 5 days. 10 tablet Tomi Bamberger, PA-C      PDMP not reviewed this encounter.   Tomi Bamberger, PA-C 12/13/20 1619

## 2020-12-21 ENCOUNTER — Other Ambulatory Visit: Payer: Self-pay

## 2020-12-21 ENCOUNTER — Ambulatory Visit (INDEPENDENT_AMBULATORY_CARE_PROVIDER_SITE_OTHER): Payer: Self-pay

## 2020-12-21 ENCOUNTER — Ambulatory Visit (HOSPITAL_COMMUNITY)
Admission: EM | Admit: 2020-12-21 | Discharge: 2020-12-21 | Disposition: A | Discharge status: Home / Self Care | Payer: Self-pay | Attending: Internal Medicine | Admitting: Internal Medicine

## 2020-12-21 ENCOUNTER — Encounter (HOSPITAL_COMMUNITY): Payer: Self-pay | Admitting: Emergency Medicine

## 2020-12-21 DIAGNOSIS — R0602 Shortness of breath: Secondary | ICD-10-CM

## 2020-12-21 DIAGNOSIS — R079 Chest pain, unspecified: Secondary | ICD-10-CM

## 2020-12-21 NOTE — Discharge Instructions (Addendum)
You need to call today for cardiology to see you  Go to er if you have any further sx

## 2020-12-21 NOTE — ED Provider Notes (Signed)
[facility]    CSN: [id number] Arrival date & time: 12/21/20  1224      History   Chief Complaint No chief complaint on file.   HPI Steven Diaz is a 25 y.o. male.   Pt was at work a few hours ago and went up stairs became sob and started to have a fast heart rate and chest pain. Pt states he went to his car and sat down but still fetl his heart racing. He looked at his watch and it said 160. Currently dose not have chest pain. Pt has had ekg done previously. Has not taken anything pta.    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 No pertinent surgical history.     Home Medications    Prior to Admission medications   Medication Sig Start Date End Date Taking? Authorizing Provider  albuterol (VENTOLIN HFA) 108 (90 Base) MCG/ACT inhaler Inhale 2 puffs into the lungs every 6 (six) hours as needed for wheezing or shortness of breath. 02/13/20   Elson Areas, PA-C  budesonide-formoterol (SYMBICORT) 80-4.5 MCG/ACT inhaler Inhale 2 puffs into the lungs daily for 1 dose. 02/13/20 02/14/20  Elson Areas, PA-C  FLUoxetine (PROZAC) 20 MG capsule Take 1 capsule (20 mg total) by mouth daily. Patient not taking: Reported on 12/09/2020 06/13/20 06/13/21  Charm Rings, NP  gabapentin (NEURONTIN) 100 MG capsule Take 1 capsule (100 mg total) by mouth 3 (three) times daily. 06/13/20 06/13/21  Charm Rings, NP  ibuprofen (ADVIL) 600 MG tablet Take 1 tablet (600 mg total) by mouth every 6 (six) hours as needed. 10/04/20   Domenick Gong, MD  oxyCODONE-acetaminophen (PERCOCET) 10-325 MG tablet Take 1 tablet by mouth every 6 (six) hours as needed for up to 12 doses for pain. Patient not taking: Reported on 12/21/2020 09/26/20   Cheryll Cockayne, MD  tiZANidine (ZANAFLEX) 4 MG tablet Take  1 tablet (4 mg total) by mouth every 6 (six) hours as needed for up to 15 doses for muscle spasms. Patient not taking: Reported on 12/09/2020 09/26/20   Cheryll Cockayne, MD    Family History Family History  Problem Relation Age of Onset   Cancer Mother     Social History Social History   Tobacco Use   Smoking status: Former    Types: Cigarettes    Quit date: 02/12/2020    Years since quitting: 0.8   Smokeless tobacco: Never   Tobacco comments:    quit two months ago  Vaping Use   Vaping Use: Some days  Substance Use Topics   Alcohol use: Not Currently   Drug use: Never     Allergies   Risperidone   Review of Systems Review of Systems  Constitutional:  Negative for chills and fever.  HENT: Negative.    Respiratory:  Positive for shortness of breath. Negative for cough.   Cardiovascular:  Positive for chest pain and palpitations.  Gastrointestinal: Negative.   Genitourinary: Negative.   Musculoskeletal: Negative.   Skin: Negative.   Neurological:  Positive for dizziness and light-headedness.    Physical Exam Triage Vital Signs ED Triage Vitals  Enc Vitals Group     BP --      Pulse Rate 12/21/20 1319 (!) 106  Resp 12/21/20 1319 (!) 22     Temp 12/21/20 1319 98.4 F (36.9 C)     Temp Source 12/21/20 1319 Oral     SpO2 12/21/20 1319 99 %     Weight --      Height --      Head Circumference --      Peak Flow --      Pain Score 12/21/20 1321 6     Pain Loc --      Pain Edu? --      Excl. in GC? --    No data found.  Updated Vital Signs Pulse (!) 106   Temp 98.4 F (36.9 C) (Oral)   Resp (!) 22   SpO2 99%   Visual Acuity Right Eye Distance:   Left Eye Distance:   Bilateral Distance:    Right Eye Near:   Left Eye Near:    Bilateral Near:     Physical Exam Constitutional:      Appearance: Normal appearance. He is obese.  HENT:     Right Ear: Tympanic membrane normal.     Left Ear: Tympanic membrane normal.     Mouth/Throat:     Mouth:  Mucous membranes are moist.  Cardiovascular:     Rate and Rhythm: Tachycardia present.  Pulmonary:     Effort: Pulmonary effort is normal.  Abdominal:     General: Abdomen is flat.  Musculoskeletal:        General: Normal range of motion.  Skin:    General: Skin is warm.     Capillary Refill: Capillary refill takes less than 2 seconds.  Neurological:     General: No focal deficit present.     Mental Status: He is alert.     UC Treatments / Results  Labs (all labs ordered are listed, but only abnormal results are displayed) Labs Reviewed - No data to display  EKG   Radiology DG Chest 2 View  Result Date: 12/21/2020 CLINICAL DATA:  Shortness of breath, chest pain EXAM: CHEST - 2 VIEW COMPARISON:  Radiograph 05/05/2020 FINDINGS: Unchanged cardiomediastinal silhouette. There is no focal airspace disease. There is no pleural effusion or visible pneumothorax. There is no acute osseous abnormality. IMPRESSION: No evidence of acute cardiopulmonary disease. Electronically Signed   By: Caprice Renshaw M.D.   On: 12/21/2020 14:07    Procedures ED EKG  Date/Time: 12/21/2020 2:23 PM Performed by: Coralyn Mark, NP Authorized by: Merrilee Jansky, MD   Previous ECG:    Previous ECG:  Compared to current   Similarity:  No change Interpretation:    Interpretation: normal   Rate:    ECG rate assessment: normal   Rhythm:    Rhythm: sinus rhythm   QRS:    QRS axis:  Normal (including critical care time)  Medications Ordered in UC Medications - No data to display  Initial Impression / Assessment and Plan / UC Course  I have reviewed the triage vital signs and the nursing notes.  Pertinent labs & imaging results that were available during my care of the patient were reviewed by me and considered in my medical decision making (see chart for details).     Follow up with cardiology  Go to er if symptoms cont  Stay hydrated well  Your x ray and chest x ray was normal    Final Clinical Impressions(s) / UC Diagnoses   Final diagnoses:  Chest pain, unspecified type  Shortness of breath  Discharge Instructions      You need to call today for cardiology to see you  Go to er if you have any further sx       ED Prescriptions   None    PDMP not reviewed this encounter.   Coralyn Mark, NP 12/21/20 1425

## 2020-12-21 NOTE — ED Triage Notes (Signed)
4 days ago was walking up 2 flight of stairs and watch reported heart rate 160.  Since then patient has felt fatigued.  Has had vertigo.  Reports chest pain in center chest, achy.  Patient has noticed increased sob with activity.  Patient has asthma.  Easily loses train of though.   No pcp.  No prior history of this type of symptoms

## 2020-12-23 ENCOUNTER — Other Ambulatory Visit: Payer: Self-pay

## 2020-12-23 ENCOUNTER — Emergency Department (HOSPITAL_COMMUNITY)
Admission: EM | Admit: 2020-12-23 | Discharge: 2020-12-23 | Disposition: A | Discharge status: Home / Self Care | Payer: Self-pay | Attending: Emergency Medicine | Admitting: Emergency Medicine

## 2020-12-23 ENCOUNTER — Emergency Department (HOSPITAL_COMMUNITY): Payer: Self-pay

## 2020-12-23 ENCOUNTER — Encounter (HOSPITAL_COMMUNITY): Payer: Self-pay

## 2020-12-23 DIAGNOSIS — R002 Palpitations: Secondary | ICD-10-CM | POA: Insufficient documentation

## 2020-12-23 DIAGNOSIS — R0602 Shortness of breath: Secondary | ICD-10-CM | POA: Insufficient documentation

## 2020-12-23 DIAGNOSIS — Z87891 Personal history of nicotine dependence: Secondary | ICD-10-CM | POA: Insufficient documentation

## 2020-12-23 DIAGNOSIS — Z7951 Long term (current) use of inhaled steroids: Secondary | ICD-10-CM | POA: Insufficient documentation

## 2020-12-23 DIAGNOSIS — Z20822 Contact with and (suspected) exposure to covid-19: Secondary | ICD-10-CM | POA: Insufficient documentation

## 2020-12-23 DIAGNOSIS — J45909 Unspecified asthma, uncomplicated: Secondary | ICD-10-CM | POA: Insufficient documentation

## 2020-12-23 DIAGNOSIS — R079 Chest pain, unspecified: Secondary | ICD-10-CM | POA: Insufficient documentation

## 2020-12-23 LAB — CBC WITH DIFFERENTIAL/PLATELET
Abs Immature Granulocytes: 0.07 10*3/uL (ref 0.00–0.07)
Basophils Absolute: 0.1 10*3/uL (ref 0.0–0.1)
Basophils Relative: 0 %
Eosinophils Absolute: 0.2 10*3/uL (ref 0.0–0.5)
Eosinophils Relative: 2 %
HCT: 47.9 % (ref 39.0–52.0)
Hemoglobin: 15.1 g/dL (ref 13.0–17.0)
Immature Granulocytes: 1 %
Lymphocytes Relative: 31 %
Lymphs Abs: 4.5 10*3/uL — ABNORMAL HIGH (ref 0.7–4.0)
MCH: 26.1 pg (ref 26.0–34.0)
MCHC: 31.5 g/dL (ref 30.0–36.0)
MCV: 82.9 fL (ref 80.0–100.0)
Monocytes Absolute: 0.8 10*3/uL (ref 0.1–1.0)
Monocytes Relative: 6 %
Neutro Abs: 8.8 10*3/uL — ABNORMAL HIGH (ref 1.7–7.7)
Neutrophils Relative %: 60 %
Platelets: 393 10*3/uL (ref 150–400)
RBC: 5.78 MIL/uL (ref 4.22–5.81)
RDW: 14.5 % (ref 11.5–15.5)
WBC: 14.4 10*3/uL — ABNORMAL HIGH (ref 4.0–10.5)
nRBC: 0 % (ref 0.0–0.2)

## 2020-12-23 LAB — COMPREHENSIVE METABOLIC PANEL
ALT: 42 U/L (ref 0–44)
AST: 27 U/L (ref 15–41)
Albumin: 4.3 g/dL (ref 3.5–5.0)
Alkaline Phosphatase: 78 U/L (ref 38–126)
Anion gap: 8 (ref 5–15)
BUN: 14 mg/dL (ref 6–20)
CO2: 27 mmol/L (ref 22–32)
Calcium: 9.5 mg/dL (ref 8.9–10.3)
Chloride: 102 mmol/L (ref 98–111)
Creatinine, Ser: 0.66 mg/dL (ref 0.61–1.24)
GFR, Estimated: >60 mL/min (ref >60)
Glucose, Bld: 88 mg/dL (ref 70–99)
Potassium: 3.9 mmol/L (ref 3.5–5.1)
Sodium: 137 mmol/L (ref 135–145)
Total Bilirubin: 0.6 mg/dL (ref 0.3–1.2)
Total Protein: 7.8 g/dL (ref 6.5–8.1)

## 2020-12-23 LAB — RESP PANEL BY RT-PCR (FLU A&B, COVID) ARPGX2
Influenza A by PCR: NEGATIVE
Influenza B by PCR: NEGATIVE
SARS Coronavirus 2 by RT PCR: NEGATIVE

## 2020-12-23 LAB — BRAIN NATRIURETIC PEPTIDE: B Natriuretic Peptide: 9 pg/mL (ref 0.0–100.0)

## 2020-12-23 LAB — TROPONIN I (HIGH SENSITIVITY): Troponin I (High Sensitivity): 4 ng/L (ref <18)

## 2020-12-23 LAB — LIPASE, BLOOD: Lipase: 23 U/L (ref 11–51)

## 2020-12-23 LAB — D-DIMER, QUANTITATIVE: D-Dimer, Quant: <0.27 ug/mL-FEU (ref 0.00–0.50)

## 2020-12-23 NOTE — ED Triage Notes (Signed)
Pt presents with about 6 days of chest pain and shortness of breath. He reports at times his heart rate will increased to 160s. He reports going to UC a few days ago for same and was told to follow-up with cardiology, but was unable to get in to see them.

## 2020-12-23 NOTE — ED Provider Notes (Signed)
Emergency Medicine Provider Triage Evaluation Note  Steven Diaz , a 25 y.o. male  was evaluated in triage.  Pt complains of six days ago he went up 4 flights of stairs twice about 6 days ago and then he has had shortness of breath and chest pain.    He states that he has been having high rates.  It has been up into the 160s.    He has had swelling in both of his feet during this.    He reports substernal chest pain that is worse with movement.  When he is sitting his chest pain is achy.  When he gets up and moves his chest pressure gets more dull.    No known radiation of pain.  Hasn't been coughing up blood.   Hasn't smoked in a year.   He notes he has a strong family history of clotting disorders.  When I ask him for further details he tells me his mother had blood cancer, and both his grandparents had blood issues.  He is unable to specify age of onset.   Review of Systems  Positive: Tachycardia, light headed, chest pain, shortness of breath, leg swelling Negative: Syncope, fevers, cough  Physical Exam  BP (!) 160/91 (BP Location: Left Arm)   Pulse (!) 122   Temp 98.8 F (37.1 C) (Oral)   Resp 20   SpO2 98%  Gen:   Awake, no distress   Resp:  Normal effort  MSK:   Moves extremities without difficulty  Other:  No appreciated pitting edema.  Bilateral calf pain.  Tachycardia  Medical Decision Making  Medically screening exam initiated at 2:16 PM.  Appropriate orders placed.  Steven Diaz was informed that the remainder of the evaluation will be completed by another provider, this initial triage assessment does not replace that evaluation, and the importance of remaining in the ED until their evaluation is complete.  Patient is a 25 year old man who presents today for evaluation of 6 days of chest pressure/pain, shortness of breath, and tachycardia.  His symptoms worsen with any exertion. He denies any fevers.  He is currently tachycardic in triage.  He is afebrile. He  does have calf pain bilaterally. Given his tachycardia and chest pain will obtain labs.  D-dimer is ordered due to unexplained tachycardia with calf pain and inability to apply PERC criteria.   Note: Portions of this report may have been transcribed using voice recognition software. Every effort was made to ensure accuracy; however, inadvertent computerized transcription errors may be present    Cristina Gong, PA-C 12/23/20 1431    Ernie Avena, MD 12/23/20 [phone]

## 2020-12-23 NOTE — ED Provider Notes (Signed)
[facility] DEPT Provider Note   CSN: [id number] Arrival date & time: 12/23/20  1340     History Chief Complaint  Patient presents with   Chest Pain    Steven Diaz is a 25 y.o. male.  The history is provided by the patient.  Palpitations Palpitations quality:  Fast Onset quality:  Gradual Timing:  Intermittent Progression:  Waxing and waning Chronicity:  New Context: caffeine   Relieved by:  Nothing Worsened by:  Nothing Associated symptoms: chest pain and shortness of breath   Associated symptoms: no back pain, no chest pressure, no cough, no diaphoresis, no dizziness, no hemoptysis and no vomiting   Risk factors: no heart disease and no hx of PE       Past Medical History:  Diagnosis Date   Asthma     Patient Active Problem List   Diagnosis Date Noted   Major depressive disorder, recurrent episode, mild ([place]) 06/13/2020   Posttraumatic stress disorder 03/08/2020    History reviewed. No pertinent surgical history.     Family History  Problem Relation Age of Onset   Cancer Mother     Social History   Tobacco Use   Smoking status: Former    Types: Cigarettes    Quit date: 02/12/2020    Years since quitting: 0.8   Smokeless tobacco: Never   Tobacco comments:    quit two months ago  Vaping Use   Vaping Use: Some days  Substance Use Topics   Alcohol use: Not Currently   Drug use: Never    Home Medications Prior to Admission medications   Medication Sig Start Date End Date Taking? Authorizing Provider  albuterol (VENTOLIN HFA) 108 (90 Base) MCG/ACT inhaler Inhale 2 puffs into the lungs every 6 (six) hours as needed for wheezing or shortness of breath. 02/13/20   Fransico Meadow, PA-C  budesonide-formoterol (SYMBICORT) 80-4.5 MCG/ACT inhaler Inhale 2 puffs into the lungs daily for 1 dose. 02/13/20 02/14/20  Fransico Meadow, PA-C  FLUoxetine (PROZAC) 20 MG capsule Take 1 capsule (20 mg total) by mouth daily. Patient not  taking: Reported on 12/09/2020 06/13/20 06/13/21  Patrecia Pour, NP  gabapentin (NEURONTIN) 100 MG capsule Take 1 capsule (100 mg total) by mouth 3 (three) times daily. 06/13/20 06/13/21  Patrecia Pour, NP  ibuprofen (ADVIL) 600 MG tablet Take 1 tablet (600 mg total) by mouth every 6 (six) hours as needed. 10/04/20   Melynda Ripple, MD  oxyCODONE-acetaminophen (PERCOCET) 10-325 MG tablet Take 1 tablet by mouth every 6 (six) hours as needed for up to 12 doses for pain. Patient not taking: Reported on 12/21/2020 09/26/20   Luna Fuse, MD  tiZANidine (ZANAFLEX) 4 MG tablet Take 1 tablet (4 mg total) by mouth every 6 (six) hours as needed for up to 15 doses for muscle spasms. Patient not taking: Reported on 12/09/2020 09/26/20   Luna Fuse, MD    Allergies    Risperidone  Review of Systems   Review of Systems  Constitutional:  Negative for chills, diaphoresis and fever.  HENT:  Negative for ear pain and sore throat.   Eyes:  Negative for pain and visual disturbance.  Respiratory:  Positive for shortness of breath. Negative for cough and hemoptysis.   Cardiovascular:  Positive for chest pain and palpitations.  Gastrointestinal:  Negative for abdominal pain and vomiting.  Genitourinary:  Negative for dysuria and hematuria.  Musculoskeletal:  Negative for arthralgias and back pain.  Skin:  Negative for color change and rash.  Neurological:  Negative for dizziness, seizures and syncope.  All other systems reviewed and are negative.  Physical Exam Updated Vital Signs BP 133/81   Pulse 87   Temp 98.8 F (37.1 C) (Oral)   Resp 18   SpO2 97%   Physical Exam Vitals and nursing note reviewed.  Constitutional:      General: He is not in acute distress.    Appearance: He is well-developed.  HENT:     Head: Normocephalic and atraumatic.  Eyes:     Conjunctiva/sclera: Conjunctivae normal.     Pupils: Pupils are equal, round, and reactive to light.  Cardiovascular:     Rate and Rhythm:  Normal rate and regular rhythm.     Pulses:          Radial pulses are 2+ on the right side and 2+ on the left side.     Heart sounds: Normal heart sounds. No murmur heard. Pulmonary:     Effort: Pulmonary effort is normal. No respiratory distress.     Breath sounds: Normal breath sounds. No decreased breath sounds or wheezing.  Abdominal:     Palpations: Abdomen is soft.     Tenderness: There is no abdominal tenderness.  Musculoskeletal:        General: No swelling. Normal range of motion.     Cervical back: Normal range of motion and neck supple.  Skin:    General: Skin is warm and dry.     Capillary Refill: Capillary refill takes less than 2 seconds.  Neurological:     Mental Status: He is alert.  Psychiatric:        Mood and Affect: Mood normal.    ED Results / Procedures / Treatments   Labs (all labs ordered are listed, but only abnormal results are displayed) Labs Reviewed  CBC WITH DIFFERENTIAL/PLATELET - Abnormal; Notable for the following components:      Result Value   WBC 14.4 (*)    Neutro Abs 8.8 (*)    Lymphs Abs 4.5 (*)    All other components within normal limits  RESP PANEL BY RT-PCR (FLU A&B, COVID) ARPGX2  COMPREHENSIVE METABOLIC PANEL  LIPASE, BLOOD  D-DIMER, QUANTITATIVE  BRAIN NATRIURETIC PEPTIDE  URINALYSIS, ROUTINE W REFLEX MICROSCOPIC  TROPONIN I (HIGH SENSITIVITY)    EKG EKG Interpretation  Date/Time:  Friday December 23 2020 13:50:47 EST Ventricular Rate:  109 PR Interval:  164 QRS Duration: 105 QT Interval:  311 QTC Calculation: 419 R Axis:   69 Text Interpretation: Sinus tachycardia Low voltage, precordial leads Confirmed by Lennice Sites ([id number]) on 12/23/2020 7:50:55 PM  Radiology DG Chest 2 View  Result Date: 12/23/2020 CLINICAL DATA:  Chest pain. EXAM: CHEST - 2 VIEW COMPARISON:  December 21, 2020. FINDINGS: The heart size and mediastinal contours are within normal limits. Both lungs are clear. The visualized skeletal structures  are unremarkable. IMPRESSION: No active cardiopulmonary disease. Electronically Signed   By: Marijo Conception M.D.   On: 12/23/2020 15:06    Procedures Procedures   Medications Ordered in ED Medications - No data to display  ED Course  I have reviewed the triage vital signs and the nursing notes.  Pertinent labs & imaging results that were available during my care of the patient were reviewed by me and considered in my medical decision making (see chart for details).    MDM Rules/Calculators/A&P  Steven Diaz is a 25 year old male who presents the ED with palpitations.  Originally tachycardic upon arrival but upon my evaluation heart rate is in the 80s.  No fever.  He has been having intermittent palpitations over the last week.  EKG shows sinus rhythm.  No ischemic changes.  He states there is a family history of blood clots in his family.  Overall he appears well.  Clear breath sounds.  Low suspicion for ACS or PE but will get troponin and D-dimer.  Will check for anemia or other electrolyte abnormalities.  Overall suspect may be symptomatic PVCs or may be intermittent SVT.  Lab work shows no significant anemia, electrolyte abnormality, kidney injury.  COVID and flu test negative.  Troponin normal.  D-dimer normal.  Doubt ACS or PE.  Overall suspect symptomatic PVCs.  We will have him follow-up with cardiology.  Discharged in good condition.  Understands return precautions.  This chart was dictated using voice recognition software.  Despite best efforts to proofread,  errors can occur which can change the documentation meaning.   Final Clinical Impression(s) / ED Diagnoses Final diagnoses:  Palpitations    Rx / DC Orders ED Discharge Orders     None        Virgina Norfolk, DO 12/23/20 2133

## 2021-01-02 NOTE — Progress Notes (Signed)
CARDIOLOGY CONSULT NOTE       Patient ID: Steven Diaz MRN: [id number] DOB/AGE: [birth year] 27 y.o.  Admit date: (Not on file) Referring Physician: Virgina Norfolk [facility] ER  Primary Physician: Patient, No Pcp Per (Inactive) Primary Cardiologist: New Reason for Consultation: Chest pain   Active Problems:   * No active hospital problems. *   HPI:  25 y.o. referred by Dr Lockie Mola for chest pain Seen in [facility] ED 12/23/20 History of depression and PTSD. Former smoker quit over a year ago Complains of associated palpitations and dyspnea Palpitations for a week HR 80's during ER evaluation NSR. Labs normal COVID and Flue negative D dimer, BNP and troponin negative Hct 47.9 Thyroid not checked CXR NAD Some asthma uses symbicort and ventolin On Prozac   Pain in chest is central pressure Occurs with exertion and when his HR elevates Also associated with dyspnea Has been worse last couple of months   He has been heavy for a long time. Currently unemployed Male partner works for [employer] from [place].  He vapes but does not use drugs   ROS All other systems reviewed and negative except as noted above  Past Medical History:  Diagnosis Date   Asthma     Family History  Problem Relation Age of Onset   Cancer Mother     Social History   Socioeconomic History   Marital status: Legally Separated    Spouse name: Not on file   Number of children: Not on file   Years of education: Not on file   Highest education level: Not on file  Occupational History   Not on file  Tobacco Use   Smoking status: Former    Types: Cigarettes    Quit date: 02/12/2020    Years since quitting: 0.8   Smokeless tobacco: Never   Tobacco comments:    quit two months ago  Vaping Use   Vaping Use: Some days  Substance and Sexual Activity   Alcohol use: Not Currently   Drug use: Never   Sexual activity: Not on file  Other Topics Concern   Not on file  Social History Narrative   Not on file    Social Determinants of Health   Financial Resource Strain: Not on file  Food Insecurity: Not on file  Transportation Needs: Not on file  Physical Activity: Not on file  Stress: Not on file  Social Connections: Not on file  Intimate Partner Violence: Not on file    History reviewed. No pertinent surgical history.    Current Outpatient Medications:    albuterol (VENTOLIN HFA) 108 (90 Base) MCG/ACT inhaler, Inhale 2 puffs into the lungs every 6 (six) hours as needed for wheezing or shortness of breath., Disp: 8 g, Rfl: 2   FLUoxetine (PROZAC) 20 MG capsule, Take 1 capsule (20 mg total) by mouth daily., Disp: 30 capsule, Rfl: 2   gabapentin (NEURONTIN) 100 MG capsule, Take 1 capsule (100 mg total) by mouth 3 (three) times daily., Disp: 90 capsule, Rfl: 2   ibuprofen (ADVIL) 600 MG tablet, Take 1 tablet (600 mg total) by mouth every 6 (six) hours as needed., Disp: 30 tablet, Rfl: 0   budesonide-formoterol (SYMBICORT) 80-4.5 MCG/ACT inhaler, Inhale 2 puffs into the lungs daily for 1 dose., Disp: 1 each, Rfl: 2   oxyCODONE-acetaminophen (PERCOCET) 10-325 MG tablet, Take 1 tablet by mouth every 6 (six) hours as needed for up to 12 doses for pain. (Patient not taking: Reported on 01/04/2021), Disp:  12 tablet, Rfl: 0   tiZANidine (ZANAFLEX) 4 MG tablet, Take 1 tablet (4 mg total) by mouth every 6 (six) hours as needed for up to 15 doses for muscle spasms. (Patient not taking: Reported on 01/04/2021), Disp: 15 tablet, Rfl: 0    Physical Exam: Blood pressure 130/88, pulse (!) 101, height 6\' 3"  (1.905 m), weight (!) 419 lb (190.1 kg), SpO2 97 %.  Affect appropriate Healthy:  appears stated age HEENT: normal Neck supple with no adenopathy JVP normal no bruits no thyromegaly Lungs clear with no wheezing and good diaphragmatic motion Heart:  S1/S2 no murmur, no rub, gallop or click PMI normal Abdomen: benighn, BS positve, no tenderness, no AAA no bruit.  No HSM or HJR Distal pulses intact  with no bruits No edema Neuro non-focal Skin warm and dry No muscular weakness   Labs:   Lab Results  Component Value Date   WBC 14.4 (H) 12/23/2020   HGB 15.1 12/23/2020   HCT 47.9 12/23/2020   MCV 82.9 12/23/2020   PLT 393 12/23/2020   No results for input(s): NA, K, CL, CO2, BUN, CREATININE, CALCIUM, PROT, BILITOT, ALKPHOS, ALT, AST, GLUCOSE in the last 168 hours.  Invalid input(s): LABALBU No results found for: CKTOTAL, CKMB, CKMBINDEX, TROPONINI No results found for: CHOL No results found for: HDL No results found for: LDLCALC No results found for: TRIG No results found for: CHOLHDL No results found for: LDLDIRECT    Radiology: DG Chest 2 View  Result Date: 12/23/2020 CLINICAL DATA:  Chest pain. EXAM: CHEST - 2 VIEW COMPARISON:  December 21, 2020. FINDINGS: The heart size and mediastinal contours are within normal limits. Both lungs are clear. The visualized skeletal structures are unremarkable. IMPRESSION: No active cardiopulmonary disease. Electronically Signed   By: December 23, 2020 M.D.   On: 12/23/2020 15:06   DG Chest 2 View  Result Date: 12/21/2020 CLINICAL DATA:  Shortness of breath, chest pain EXAM: CHEST - 2 VIEW COMPARISON:  Radiograph 05/05/2020 FINDINGS: Unchanged cardiomediastinal silhouette. There is no focal airspace disease. There is no pleural effusion or visible pneumothorax. There is no acute osseous abnormality. IMPRESSION: No evidence of acute cardiopulmonary disease. Electronically Signed   By: 05/07/2020 M.D.   On: 12/21/2020 14:07   DG Shoulder Left  Result Date: 12/09/2020 CLINICAL DATA:  Acute shoulder pain after falling duct within normal EXAM: LEFT SHOULDER - 2+ VIEW COMPARISON:  None. FINDINGS: There is no evidence of fracture or dislocation. There is no evidence of arthropathy or other focal bone abnormality. Soft tissues are unremarkable. Visualized lung field is clear. IMPRESSION: No acute osseous abnormality. Electronically Signed   By:  13/05/2020 M.D.   On: 12/09/2020 16:48   DG Foot 2 Views Right  Result Date: 12/09/2020 CLINICAL DATA:  Tender to palpation first and second metatarsals after injury. EXAM: RIGHT FOOT - 2 VIEW COMPARISON:  None. FINDINGS: There is no evidence of fracture or dislocation. There is no evidence of arthropathy or other focal bone abnormality. Mild soft tissue swelling over the dorsum of the foot. IMPRESSION: No acute osseous abnormality. Mild soft tissue swelling over the dorsum of the foot. Electronically Signed   By: 13/05/2020 M.D.   On: 12/09/2020 16:47    EKG: ST rate 109 low voltage    ASSESSMENT AND PLAN:   Atypical chest pain:  r/o in ER ECG normal ST segments negative troponin Shared decision making favor cardiac CTA to r/o CAD or anomalous origin and r/o  any other chest pathology Despite his weight he is young and should be able to get diagnostic scan as he is unlikely to have calcium will give 100 mg lopressor night before and 2 hours morning before Has had BMET in ER  Palpitations:  doubt any arrhythmia in NSR in ER Check TSH/T4 TTE to  R/o structural heart dx Dyspnea:  CXR NAD normal exam previous smoker with asthma Observe See above regarding TTE and can look at lung windows on CT D dimer negative in ER   TSH/T4 Cardiac CTA TTE  F/U PRN if above normal   Signed: Charlton Haws 01/04/2021, 1:10 PM

## 2021-01-04 ENCOUNTER — Encounter: Payer: Self-pay | Admitting: Cardiovascular Disease

## 2021-01-04 ENCOUNTER — Ambulatory Visit (INDEPENDENT_AMBULATORY_CARE_PROVIDER_SITE_OTHER): Payer: Self-pay | Admitting: Cardiovascular Disease

## 2021-01-04 ENCOUNTER — Other Ambulatory Visit: Payer: Self-pay

## 2021-01-04 VITALS — BP 130/88 | HR 101 | Ht 75.0 in | Wt >= 6400 oz

## 2021-01-04 DIAGNOSIS — R002 Palpitations: Secondary | ICD-10-CM

## 2021-01-04 DIAGNOSIS — R0609 Other forms of dyspnea: Secondary | ICD-10-CM

## 2021-01-04 DIAGNOSIS — R079 Chest pain, unspecified: Secondary | ICD-10-CM

## 2021-01-04 MED ORDER — METOPROLOL TARTRATE 100 MG PO TABS: ORAL_TABLET | ORAL | 1 refills | Status: DC

## 2021-01-04 NOTE — Patient Instructions (Signed)
Medication Instructions: METOPROLOL 100 MG 1 TAB NIGHT BEFORE CT AND DAY OF CT 1 TAB 2 HOURS BEFORE  *If you need a refill on your cardiac medications before your next appointment, please call your pharmacy*   Lab Work: TODAY TSH AND T4 If you have labs (blood work) drawn today and your tests are completely normal, you will receive your results only by: MyChart Message (if you have MyChart) OR A paper copy in the mail If you have any lab test that is abnormal or we need to change your treatment, we will call you to review the results.   Testing/Procedures: Your physician has requested that you have an echocardiogram. Echocardiography is a painless test that uses sound waves to create images of your heart. It provides your doctor with information about the size and shape of your heart and how well your heart's chambers and valves are working. This procedure takes approximately one hour. There are no restrictions for this procedure.    Your cardiac CT will be scheduled at one of the below locations:   [facility] [address] [place], [place] [address] [phone]  If scheduled at [facility], please arrive at the [facility] main entrance (entrance A) of [facility] 30 minutes prior to test start time. You can use the FREE valet parking offered at the main entrance (encouraged to control the heart rate for the test) Proceed to the [facility] Radiology Department (first floor) to check-in and test prep.   Please follow these instructions carefully (unless otherwise directed):  Hold all erectile dysfunction medications at least 3 days (72 hrs) prior to test.  On the Night Before the Test: Be sure to Drink plenty of water. Do not consume any caffeinated/decaffeinated beverages or chocolate 12 hours prior to your test. Do not take any antihistamines 12 hours prior to your test. .  On the Day of the Test: Drink plenty of water until 1 hour prior to  the test. Do not eat any food 4 hours prior to the test. You may take your regular medications prior to the test.  Take metoprolol (Lopressor) two hours prior to test. 1 TAB NIGHT BEFORE CT AND 1 TAB 2 HOURS BEFORE CT  *For Clinical Staff only. Please instruct patient the following:* Heart Rate Medication Recommendations for Cardiac CT  Resting HR < 50 bpm  No medication  Resting HR 50-60 bpm and BP >110/50 mmHG   Consider Metoprolol tartrate 25 mg PO 90-120 min prior to scan  Resting HR 60-65 bpm and BP >110/50 mmHG  Metoprolol tartrate 50 mg PO 90-120 minutes prior to scan   Resting HR > 65 bpm and BP >110/50 mmHG  Metoprolol tartrate 100 mg PO 90-120 minutes prior to scan       After the Test: Drink plenty of water. After receiving IV contrast, you may experience a mild flushed feeling. This is normal. On occasion, you may experience a mild rash up to 24 hours after the test. This is not dangerous. If this occurs, you can take Benadryl 25 mg and increase your fluid intake. If you experience trouble breathing, this can be serious. If it is severe call 911 IMMEDIATELY. If it is mild, please call our office. If you take any of these medications: Glipizide/Metformin, Avandament, Glucavance, please do not take 48 hours after completing test unless otherwise instructed.  Please allow 2-4 weeks for scheduling of routine cardiac CTs. Some insurance companies require a pre-authorization which may  delay scheduling of this test.   For non-scheduling related questions, please contact the cardiac imaging nurse navigator should you have any questions/concerns: Rockwell Alexandria, Cardiac Imaging Nurse Navigator Larey Brick, Cardiac Imaging Nurse Navigator [facility] Direct Office Dial: [phone]   For scheduling needs, including cancellations and rescheduling, please call [place], [phone].    Follow-Up: At [facility], you and your health needs are our  priority.  As part of our continuing mission to provide you with exceptional heart care, we have created designated Provider Care Teams.  These Care Teams include your primary Cardiologist (physician) and Advanced Practice Providers (APPs -  Physician Assistants and Nurse Practitioners) who all work together to provide you with the care you need, when you need it.  We recommend signing up for the patient portal called "MyChart".  Sign up information is provided on this After Visit Summary.  MyChart is used to connect with patients for Virtual Visits (Telemedicine).  Patients are able to view lab/test results, encounter notes, upcoming appointments, etc.  Non-urgent messages can be sent to your provider as well.   To learn more about what you can do with MyChart, go to [email or web address].    Your next appointment:   AS NEEDED  The format for your next appointment:     Provider:       Other Instructions

## 2021-01-05 LAB — TSH: TSH: 4.11 u[IU]/mL (ref 0.450–4.500)

## 2021-01-05 LAB — T4: T4, Total: 7.2 ug/dL (ref 4.5–12.0)

## 2021-01-13 ENCOUNTER — Ambulatory Visit: Payer: Self-pay | Admitting: Cardiovascular Disease

## 2021-02-07 ENCOUNTER — Ambulatory Visit (HOSPITAL_COMMUNITY): Payer: Medicaid Other | Attending: Cardiovascular Disease

## 2021-02-07 ENCOUNTER — Encounter (HOSPITAL_COMMUNITY): Payer: Self-pay | Admitting: Cardiovascular Disease

## 2021-02-24 ENCOUNTER — Encounter (HOSPITAL_COMMUNITY): Payer: Self-pay

## 2021-03-03 ENCOUNTER — Emergency Department (HOSPITAL_COMMUNITY): Payer: Self-pay

## 2021-03-03 ENCOUNTER — Emergency Department (HOSPITAL_COMMUNITY)
Admission: EM | Admit: 2021-03-03 | Discharge: 2021-03-04 | Disposition: A | Discharge status: Home Health | Payer: Self-pay | Attending: Emergency Medicine | Admitting: Emergency Medicine

## 2021-03-03 ENCOUNTER — Other Ambulatory Visit: Payer: Self-pay

## 2021-03-03 ENCOUNTER — Encounter (HOSPITAL_COMMUNITY): Payer: Self-pay

## 2021-03-03 DIAGNOSIS — R0602 Shortness of breath: Secondary | ICD-10-CM | POA: Insufficient documentation

## 2021-03-03 DIAGNOSIS — W19XXXA Unspecified fall, initial encounter: Secondary | ICD-10-CM | POA: Insufficient documentation

## 2021-03-03 DIAGNOSIS — R42 Dizziness and giddiness: Secondary | ICD-10-CM | POA: Insufficient documentation

## 2021-03-03 DIAGNOSIS — Z7951 Long term (current) use of inhaled steroids: Secondary | ICD-10-CM | POA: Insufficient documentation

## 2021-03-03 DIAGNOSIS — R079 Chest pain, unspecified: Secondary | ICD-10-CM

## 2021-03-03 DIAGNOSIS — Z20822 Contact with and (suspected) exposure to covid-19: Secondary | ICD-10-CM | POA: Insufficient documentation

## 2021-03-03 DIAGNOSIS — Z79899 Other long term (current) drug therapy: Secondary | ICD-10-CM | POA: Insufficient documentation

## 2021-03-03 DIAGNOSIS — I10 Essential (primary) hypertension: Secondary | ICD-10-CM | POA: Insufficient documentation

## 2021-03-03 DIAGNOSIS — R0789 Other chest pain: Secondary | ICD-10-CM | POA: Insufficient documentation

## 2021-03-03 DIAGNOSIS — R978 Other abnormal tumor markers: Secondary | ICD-10-CM | POA: Insufficient documentation

## 2021-03-03 DIAGNOSIS — R000 Tachycardia, unspecified: Secondary | ICD-10-CM | POA: Insufficient documentation

## 2021-03-03 HISTORY — DX: Bipolar disorder, unspecified: F31.9

## 2021-03-03 LAB — CBC WITH DIFFERENTIAL/PLATELET
Abs Immature Granulocytes: 0.03 10*3/uL (ref 0.00–0.07)
Basophils Absolute: 0 10*3/uL (ref 0.0–0.1)
Basophils Relative: 0 %
Eosinophils Absolute: 0.2 10*3/uL (ref 0.0–0.5)
Eosinophils Relative: 2 %
HCT: 47.3 % (ref 39.0–52.0)
Hemoglobin: 15 g/dL (ref 13.0–17.0)
Immature Granulocytes: 0 %
Lymphocytes Relative: 40 %
Lymphs Abs: 4.1 10*3/uL — ABNORMAL HIGH (ref 0.7–4.0)
MCH: 25.8 pg — ABNORMAL LOW (ref 26.0–34.0)
MCHC: 31.7 g/dL (ref 30.0–36.0)
MCV: 81.4 fL (ref 80.0–100.0)
Monocytes Absolute: 0.8 10*3/uL (ref 0.1–1.0)
Monocytes Relative: 7 %
Neutro Abs: 5 10*3/uL (ref 1.7–7.7)
Neutrophils Relative %: 51 %
Platelets: 382 10*3/uL (ref 150–400)
RBC: 5.81 MIL/uL (ref 4.22–5.81)
RDW: 14.4 % (ref 11.5–15.5)
WBC: 10.1 10*3/uL (ref 4.0–10.5)
nRBC: 0 % (ref 0.0–0.2)

## 2021-03-03 LAB — URINALYSIS, ROUTINE W REFLEX MICROSCOPIC
Bilirubin Urine: NEGATIVE
Glucose, UA: NEGATIVE mg/dL
Hgb urine dipstick: NEGATIVE
Ketones, ur: NEGATIVE mg/dL
Leukocytes,Ua: NEGATIVE
Nitrite: NEGATIVE
Protein, ur: NEGATIVE mg/dL
Specific Gravity, Urine: 1.025 (ref 1.005–1.030)
pH: 5 (ref 5.0–8.0)

## 2021-03-03 LAB — COMPREHENSIVE METABOLIC PANEL
ALT: 49 U/L — ABNORMAL HIGH (ref 0–44)
AST: 29 U/L (ref 15–41)
Albumin: 4.1 g/dL (ref 3.5–5.0)
Alkaline Phosphatase: 78 U/L (ref 38–126)
Anion gap: 9 (ref 5–15)
BUN: 11 mg/dL (ref 6–20)
CO2: 22 mmol/L (ref 22–32)
Calcium: 9.1 mg/dL (ref 8.9–10.3)
Chloride: 99 mmol/L (ref 98–111)
Creatinine, Ser: 0.68 mg/dL (ref 0.61–1.24)
GFR, Estimated: >60 mL/min (ref >60)
Glucose, Bld: 100 mg/dL — ABNORMAL HIGH (ref 70–99)
Potassium: 3.7 mmol/L (ref 3.5–5.1)
Sodium: 130 mmol/L — ABNORMAL LOW (ref 135–145)
Total Bilirubin: 0.5 mg/dL (ref 0.3–1.2)
Total Protein: 7.5 g/dL (ref 6.5–8.1)

## 2021-03-03 LAB — BRAIN NATRIURETIC PEPTIDE: B Natriuretic Peptide: 115.5 pg/mL — ABNORMAL HIGH (ref 0.0–100.0)

## 2021-03-03 LAB — LIPASE, BLOOD: Lipase: 24 U/L (ref 11–51)

## 2021-03-03 LAB — TROPONIN I (HIGH SENSITIVITY)
Troponin I (High Sensitivity): 2 ng/L (ref <18)
Troponin I (High Sensitivity): 2 ng/L (ref <18)

## 2021-03-03 MED ORDER — AMLODIPINE BESYLATE 5 MG PO TABS
10.0000 mg | ORAL_TABLET | Freq: Once | ORAL | Status: AC
  Administered 2021-03-03: 10 mg via ORAL
  Filled 2021-03-03: qty 2

## 2021-03-03 MED ORDER — IOHEXOL 350 MG/ML SOLN
75.0000 mL | Freq: Once | INTRAVENOUS | Status: AC | PRN
  Administered 2021-03-03: 75 mL via INTRAVENOUS

## 2021-03-03 NOTE — ED Provider Notes (Signed)
[facility]-EMERGENCY DEPT Provider Note   CSN: [id number] Arrival date & time: 03/03/21  1745     History  Chief Complaint  Patient presents with   Chest Pain   Fall   Dizziness    Steven Diaz is a 26 y.o. male with a history of intermittent chest pain presenting to ED with shortness of breath and chest pain.  The patient reports that he woke up from sleep this morning with sudden pressure on his chest and difficulty breathing.  He is never felt this type of pressure before.  He tried to take a shower and began to feel very dizzy and fell.  He continues to have chest pressure, specifically when he lies flat, and also shortness of breath on lying flat.  He denies any known medical problems himself, but he is currently seeing a cardiologist and undergoing outpatient evaluation for cardiac disease, has an echocardiogram scheduled in 1 week, did miss a CT coronary CT scan this month.  He has a family history of congestive heart failure in his grandmother.  Also reports a family history of "blood clots" in his family, but is not sure about the specific details.  He himself denies any history of DVT or PE.  He reports that he vapes nicotine.  He feels that he has untreated hypertension but is not currently on medications for that.  HPI     Home Medications Prior to Admission medications   Medication Sig Start Date End Date Taking? Authorizing Provider  amLODipine (NORVASC) 5 MG tablet Take 1 tablet (5 mg total) by mouth daily for 30 doses. 03/04/21 04/03/21 Yes Leesa Leifheit, Kermit Balo, MD  albuterol (VENTOLIN HFA) 108 (90 Base) MCG/ACT inhaler Inhale 2 puffs into the lungs every 6 (six) hours as needed for wheezing or shortness of breath. 02/13/20   Elson Areas, PA-C  budesonide-formoterol (SYMBICORT) 80-4.5 MCG/ACT inhaler Inhale 2 puffs into the lungs daily for 1 dose. 02/13/20 02/14/20  Elson Areas, PA-C  FLUoxetine (PROZAC) 20 MG capsule Take 1 capsule (20 mg total)  by mouth daily. 06/13/20 06/13/21  Charm Rings, NP  gabapentin (NEURONTIN) 100 MG capsule Take 1 capsule (100 mg total) by mouth 3 (three) times daily. 06/13/20 06/13/21  Charm Rings, NP  ibuprofen (ADVIL) 600 MG tablet Take 1 tablet (600 mg total) by mouth every 6 (six) hours as needed. 10/04/20   Domenick Gong, MD  metoprolol tartrate (LOPRESSOR) 100 MG tablet 1 TAB NIGHT BEFORE CT AND 1 TAB 2 HOURS BEFORE CT 01/04/21   Croitoru, Mihai, MD  oxyCODONE-acetaminophen (PERCOCET) 10-325 MG tablet Take 1 tablet by mouth every 6 (six) hours as needed for up to 12 doses for pain. Patient not taking: Reported on 01/04/2021 09/26/20   Cheryll Cockayne, MD  tiZANidine (ZANAFLEX) 4 MG tablet Take 1 tablet (4 mg total) by mouth every 6 (six) hours as needed for up to 15 doses for muscle spasms. Patient not taking: Reported on 01/04/2021 09/26/20   Cheryll Cockayne, MD      Allergies    Risperidone    Review of Systems   Review of Systems  Physical Exam Updated Vital Signs BP 109/81    Pulse 77    Temp 98 F (36.7 C) (Oral)    Resp (!) 22    Ht 6\' 3"  (1.905 m)    Wt (!) 176.9 kg    SpO2 99%    BMI 48.75 kg/m  Physical Exam Constitutional:  General: He is not in acute distress.    Appearance: He is obese.  HENT:     Head: Normocephalic and atraumatic.  Eyes:     Conjunctiva/sclera: Conjunctivae normal.     Pupils: Pupils are equal, round, and reactive to light.  Cardiovascular:     Rate and Rhythm: Regular rhythm. Tachycardia present.  Pulmonary:     Effort: Pulmonary effort is normal. No respiratory distress.     Comments: Patient's habitus limits pulmonary auscultation Abdominal:     General: There is no distension.     Tenderness: There is no abdominal tenderness.  Skin:    General: Skin is warm and dry.  Neurological:     General: No focal deficit present.     Mental Status: He is alert. Mental status is at baseline.  Psychiatric:        Mood and Affect: Mood normal.         Behavior: Behavior normal.    ED Results / Procedures / Treatments   Labs (all labs ordered are listed, but only abnormal results are displayed) Labs Reviewed  COMPREHENSIVE METABOLIC PANEL - Abnormal; Notable for the following components:      Result Value   Sodium 130 (*)    Glucose, Bld 100 (*)    ALT 49 (*)    All other components within normal limits  CBC WITH DIFFERENTIAL/PLATELET - Abnormal; Notable for the following components:   MCH 25.8 (*)    Lymphs Abs 4.1 (*)    All other components within normal limits  BRAIN NATRIURETIC PEPTIDE - Abnormal; Notable for the following components:   B Natriuretic Peptide 115.5 (*)    All other components within normal limits  RESP PANEL BY RT-PCR (FLU A&B, COVID) ARPGX2  LIPASE, BLOOD  URINALYSIS, ROUTINE W REFLEX MICROSCOPIC  TROPONIN I (HIGH SENSITIVITY)  TROPONIN I (HIGH SENSITIVITY)    EKG EKG Interpretation  Date/Time:  Friday March 03 2021 17:52:04 EST Ventricular Rate:  99 PR Interval:  159 QRS Duration: 106 QT Interval:  312 QTC Calculation: 401 R Axis:   93 Text Interpretation: Sinus rhythm Low voltage, precordial leads Consider right ventricular hypertrophy Baseline wander in lead(s) V3 V5 Confirmed by Alvester Chou [id number]) on 03/03/2021 10:11:42 PM  Radiology DG Chest 2 View  Result Date: 03/03/2021 CLINICAL DATA:  Chest pressure EXAM: CHEST - 2 VIEW COMPARISON:  Chest x-ray 12/23/2020 FINDINGS: The heart and mediastinal contours are within normal limits. No focal consolidation. No pulmonary edema. No pleural effusion. No pneumothorax. No acute osseous abnormality. IMPRESSION: No active cardiopulmonary disease. Electronically Signed   By: Tish Frederickson M.D.   On: 03/03/2021 18:53   CT Angio Chest PE W and/or Wo Contrast  Result Date: 03/04/2021 CLINICAL DATA:  Chest pain and pressure EXAM: CT ANGIOGRAPHY CHEST WITH CONTRAST TECHNIQUE: Multidetector CT imaging of the chest was performed using the standard  protocol during bolus administration of intravenous contrast. Multiplanar CT image reconstructions and MIPs were obtained to evaluate the vascular anatomy. RADIATION DOSE REDUCTION: This exam was performed according to the departmental dose-optimization program which includes automated exposure control, adjustment of the mA and/or kV according to patient size and/or use of iterative reconstruction technique. CONTRAST:  59mL OMNIPAQUE IOHEXOL 350 MG/ML SOLN COMPARISON:  Chest x-ray from earlier in the same day. FINDINGS: Cardiovascular: Thoracic aorta shows no significant atherosclerotic calcifications. No aneurysmal dilatation or dissection is noted. Pulmonary artery is well visualized within normal branching pattern. Filling defect to suggest pulmonary embolism is noted.  Mediastinum/Nodes: Thoracic inlet is within normal limits. No sizable hilar or mediastinal adenopathy is noted. The esophagus as visualized is within normal limits. Lungs/Pleura: Lungs are clear. No pleural effusion or pneumothorax. Upper Abdomen: Visualized upper abdomen is within normal limits. Musculoskeletal: No chest wall abnormality. No acute or significant osseous findings. Review of the MIP images confirms the above findings. IMPRESSION: No evidence of pulmonary emboli. No acute abnormality seen. Electronically Signed   By: Alcide CleverMark  Lukens M.D.   On: 03/04/2021 00:11    Procedures Procedures    Medications Ordered in ED Medications  amLODipine (NORVASC) tablet 10 mg (10 mg Oral Given 03/03/21 2303)  iohexol (OMNIPAQUE) 350 MG/ML injection 75 mL (75 mLs Intravenous Contrast Given 03/03/21 2357)    ED Course/ Medical Decision Making/ A&P Clinical Course as of 03/04/21 0024  Fri Mar 03, 2021  2331 Delta trop low, flat [MT]    Clinical Course User Index [MT] Terald Sleeperrifan, Broderic Bara J, MD                           Medical Decision Making Amount and/or Complexity of Data Reviewed Radiology: ordered.  Risk Prescription drug  management.   This patient presents to the ED with concern for chest pressure. This involves an extensive number of treatment options, and is a complaint that carries with it a high risk of complications and morbidity.  The differential diagnosis includes chronic nonspecific chest pain versus reflux versus pulmonary embolism versus pneumonia versus ACS versus other  External records from outside source obtained and reviewed including frequent ED visits for chest pressure, including March 2022, November 2022.  Cardiology office evaluation in November 2022 by Dr. Eden EmmsNishan, who decided in shared decision making to favor cardiac CTA of the patient, which the patient unfortunately missed as he was echo early in January.  Patient is pending an echocardiogram next week in the office.  I ordered and personally interpreted labs.  The pertinent results include: Delta troponins flat and negative, 2 and 2, COVID and flu are negative.  UA without sign of infection.  No acute anemia on hemoglobin, white blood cell count within normal limits.  CMP shows very mild hyponatremia, otherwise unremarkable.  BNP is very mildly elevated at 115.  I ordered imaging studies including x-ray of the chest, CT PE I independently visualized and interpreted imaging which showed no focal infiltrate or evidence of cardiomegaly on x-ray of the chest.  CT PE with no evidence of acute pulmonary embolism, no cardiomegaly, no pericardial effusion, no pneumothorax I agree with the radiologist interpretation  The patient was maintained on a cardiac monitor.  I personally viewed and interpreted the cardiac monitored which showed an underlying rhythm of: Sinus rhythm  Per my interpretation the patient's ECG shows normal sinus rhythm without acute ischemic findings  After the interventions noted above, I reevaluated the patient and found that they have: stayed the same  Discussed with the patient and his partner at the bedside his work-up and  findings.  BNP is mildly elevated, which may be a false positive, as I do not see otherwise indication of acute congestive heart failure, no pulmonary edema or hypoxia.  He can follow-up for his echocardiogram as an outpatient.         Final Clinical Impression(s) / ED Diagnoses Final diagnoses:  Chest pain, unspecified type  Hypertension, unspecified type    Rx / DC Orders ED Discharge Orders  Ordered    amLODipine (NORVASC) 5 MG tablet  Daily        03/04/21 0011              Terald Sleeperrifan, Madox Corkins J, MD 03/04/21 [phone]

## 2021-03-03 NOTE — ED Provider Triage Note (Signed)
Emergency Medicine Provider Triage Evaluation Note  Steven Diaz , a 26 y.o. male  was evaluated in triage.  Pt complains of shortness of breath and chest pain. He states that at night the chest pain/pressure woke him up from his sleep.  He reportedly grabbed his partner and was confused. He states that he has seen cardiology since his previous ED visit and has a echo scheduled for the seventh. He states that this episode is much worse. He does note that he got dizzy in the shower today and fell.  He denies striking his head or passing out, denies concern for any injuries for this.   Physical Exam  BP (!) 152/99 (BP Location: Right Arm)    Pulse 95    Temp 98.4 F (36.9 C) (Oral)    Resp (!) 24    Ht 6\' 3"  (1.905 m)    Wt (!) 176.9 kg    SpO2 100%    BMI 48.75 kg/m  Gen:   Awake, no distress   Resp:  Normal effort  MSK:   Moves extremities without difficulty  Other:  Normal speech.   Medical Decision Making  Medically screening exam initiated at 6:13 PM.  Appropriate orders placed.  Steven Diaz was informed that the remainder of the evaluation will be completed by another provider, this initial triage assessment does not replace that evaluation, and the importance of remaining in the ED until their evaluation is complete.  Note: Portions of this report may have been transcribed using voice recognition software. Every effort was made to ensure accuracy; however, inadvertent computerized transcription errors may be present    [name], PA-C 03/03/21 1815

## 2021-03-03 NOTE — ED Triage Notes (Signed)
Patient reports that he had chest pain and SOB that woke him out of a sleep this AM. Patient went to the shower and fell due to dizziness. Patient reports frequent episodes of chest pain x 1 1/2 months.

## 2021-03-04 LAB — RESP PANEL BY RT-PCR (FLU A&B, COVID) ARPGX2
Influenza A by PCR: NEGATIVE
Influenza B by PCR: NEGATIVE
SARS Coronavirus 2 by RT PCR: NEGATIVE

## 2021-03-04 MED ORDER — AMLODIPINE BESYLATE 5 MG PO TABS: 5.0000 mg | ORAL_TABLET | Freq: Every day | ORAL | 0 refills | Status: DC

## 2021-03-04 NOTE — Discharge Instructions (Addendum)
For your blood pressure I started you on amlodipine 5 mg daily.  You should keep a daily log of your blood pressures every morning when you wake up, and write these down.  Bring the diary to the cardiologist with you.  It is very importantly follow-up with a heart doctor for your scheduled echocardiogram next week.

## 2021-03-14 ENCOUNTER — Ambulatory Visit (HOSPITAL_COMMUNITY): Payer: Self-pay | Attending: Cardiology

## 2021-03-14 ENCOUNTER — Other Ambulatory Visit: Payer: Self-pay

## 2021-03-14 ENCOUNTER — Encounter: Payer: Self-pay | Admitting: Cardiovascular Disease

## 2021-03-14 DIAGNOSIS — R079 Chest pain, unspecified: Secondary | ICD-10-CM | POA: Insufficient documentation

## 2021-03-14 LAB — ECHOCARDIOGRAM COMPLETE
Area-P 1/2: 4.31 cm2
S' Lateral: 3.5 cm

## 2021-03-14 MED ORDER — PERFLUTREN LIPID MICROSPHERE
1.0000 mL | INTRAVENOUS | Status: AC | PRN
  Administered 2021-03-14: 1 mL via INTRAVENOUS

## 2021-03-14 NOTE — Progress Notes (Addendum)
Patient had an allergic reaction. Patient experienced lower back pain. The DOD, Dr. Lalla Brothers was notified. He assessed the patient. Symptoms spontaneously resolved. Patient was given 30 cc normal saline was given to the patient. Patient was discharged in stable condition.

## 2021-03-16 ENCOUNTER — Telehealth (HOSPITAL_COMMUNITY): Payer: Self-pay | Admitting: Licensed Clinical Social Worker

## 2021-03-16 NOTE — Telephone Encounter (Signed)
CSW received request to contact patient to assist as he is uninsured and needs PCP follow up. Patient reports he was in the ED on 03-03-21 and was prescribed medication for high BP but only has a 30 day supply. He states he is unsure what to do after that and has no insurance. CSW discussed Cardiology follow up and provided number, discussed [facility] and will work on obtaining a PCP appointment at [facility]. Patient agrees to call for Cardiology appointment and follow up with Financial Counseling. CSW will explore PCP appointment and return call to patient. CSW available as needed. Lasandra Beech, LCSW, CCSW-MCS [phone]

## 2021-03-17 ENCOUNTER — Telehealth (HOSPITAL_COMMUNITY): Payer: Self-pay | Admitting: Licensed Clinical Social Worker

## 2021-03-17 NOTE — Telephone Encounter (Signed)
CSW was able to obtain a PCP appointment at [facility] on 03-29-21 at 10:50 am. CSW attempted to contact patient with no success. Unable to leave message as mailbox is full. Will attempt again. Raquel Sarna, [place], [place]

## 2021-03-19 ENCOUNTER — Other Ambulatory Visit: Payer: Self-pay

## 2021-03-19 ENCOUNTER — Encounter (HOSPITAL_COMMUNITY): Payer: Self-pay

## 2021-03-19 ENCOUNTER — Emergency Department (HOSPITAL_COMMUNITY)
Admission: EM | Admit: 2021-03-19 | Discharge: 2021-03-20 | Disposition: A | Discharge status: Home / Self Care | Payer: Self-pay | Attending: Emergency Medicine | Admitting: Emergency Medicine

## 2021-03-19 DIAGNOSIS — I10 Essential (primary) hypertension: Secondary | ICD-10-CM | POA: Insufficient documentation

## 2021-03-19 DIAGNOSIS — R0600 Dyspnea, unspecified: Secondary | ICD-10-CM | POA: Insufficient documentation

## 2021-03-19 DIAGNOSIS — Z79899 Other long term (current) drug therapy: Secondary | ICD-10-CM | POA: Insufficient documentation

## 2021-03-19 DIAGNOSIS — R0789 Other chest pain: Secondary | ICD-10-CM | POA: Insufficient documentation

## 2021-03-19 DIAGNOSIS — Z87891 Personal history of nicotine dependence: Secondary | ICD-10-CM | POA: Insufficient documentation

## 2021-03-19 DIAGNOSIS — R202 Paresthesia of skin: Secondary | ICD-10-CM | POA: Insufficient documentation

## 2021-03-19 NOTE — ED Triage Notes (Signed)
BIB EMS c/o intermittent chest pain throughout the day. Seen at [facility] last week, + PE on CT but not placed on blood thinner?   States he's had intermittently SOB and CP since being dx with COVID in 2020.

## 2021-03-20 ENCOUNTER — Emergency Department (HOSPITAL_COMMUNITY): Payer: Self-pay

## 2021-03-20 ENCOUNTER — Telehealth (HOSPITAL_COMMUNITY): Payer: Self-pay | Admitting: Licensed Clinical Social Worker

## 2021-03-20 LAB — CBC WITH DIFFERENTIAL/PLATELET
Abs Immature Granulocytes: 0.02 10*3/uL (ref 0.00–0.07)
Basophils Absolute: 0 10*3/uL (ref 0.0–0.1)
Basophils Relative: 0 %
Eosinophils Absolute: 0.2 10*3/uL (ref 0.0–0.5)
Eosinophils Relative: 2 %
HCT: 44 % (ref 39.0–52.0)
Hemoglobin: 13.8 g/dL (ref 13.0–17.0)
Immature Granulocytes: 0 %
Lymphocytes Relative: 42 %
Lymphs Abs: 4.5 10*3/uL — ABNORMAL HIGH (ref 0.7–4.0)
MCH: 25.9 pg — ABNORMAL LOW (ref 26.0–34.0)
MCHC: 31.4 g/dL (ref 30.0–36.0)
MCV: 82.6 fL (ref 80.0–100.0)
Monocytes Absolute: 0.8 10*3/uL (ref 0.1–1.0)
Monocytes Relative: 8 %
Neutro Abs: 5 10*3/uL (ref 1.7–7.7)
Neutrophils Relative %: 48 %
Platelets: 358 10*3/uL (ref 150–400)
RBC: 5.33 MIL/uL (ref 4.22–5.81)
RDW: 14.3 % (ref 11.5–15.5)
WBC: 10.6 10*3/uL — ABNORMAL HIGH (ref 4.0–10.5)
nRBC: 0 % (ref 0.0–0.2)

## 2021-03-20 LAB — COMPREHENSIVE METABOLIC PANEL
ALT: 47 U/L — ABNORMAL HIGH (ref 0–44)
AST: 31 U/L (ref 15–41)
Albumin: 3.6 g/dL (ref 3.5–5.0)
Alkaline Phosphatase: 80 U/L (ref 38–126)
Anion gap: 10 (ref 5–15)
BUN: 9 mg/dL (ref 6–20)
CO2: 23 mmol/L (ref 22–32)
Calcium: 9.2 mg/dL (ref 8.9–10.3)
Chloride: 105 mmol/L (ref 98–111)
Creatinine, Ser: 0.78 mg/dL (ref 0.61–1.24)
GFR, Estimated: >60 mL/min (ref >60)
Glucose, Bld: 124 mg/dL — ABNORMAL HIGH (ref 70–99)
Potassium: 3.9 mmol/L (ref 3.5–5.1)
Sodium: 138 mmol/L (ref 135–145)
Total Bilirubin: 0.4 mg/dL (ref 0.3–1.2)
Total Protein: 6.9 g/dL (ref 6.5–8.1)

## 2021-03-20 LAB — BRAIN NATRIURETIC PEPTIDE: B Natriuretic Peptide: 6.2 pg/mL (ref 0.0–100.0)

## 2021-03-20 LAB — TROPONIN I (HIGH SENSITIVITY)
Troponin I (High Sensitivity): 5 ng/L (ref <18)
Troponin I (High Sensitivity): 4 ng/L (ref <18)

## 2021-03-20 MED ORDER — ALBUTEROL SULFATE HFA 108 (90 BASE) MCG/ACT IN AERS
2.0000 | INHALATION_SPRAY | RESPIRATORY_TRACT | Status: DC | PRN
  Filled 2021-03-20: qty 6.7

## 2021-03-20 MED ORDER — IPRATROPIUM-ALBUTEROL 0.5-2.5 (3) MG/3ML IN SOLN
3.0000 mL | Freq: Once | RESPIRATORY_TRACT | Status: AC
  Administered 2021-03-20: 3 mL via RESPIRATORY_TRACT
  Filled 2021-03-20: qty 3

## 2021-03-20 MED ORDER — PREDNISONE 20 MG PO TABS
60.0000 mg | ORAL_TABLET | Freq: Once | ORAL | Status: AC
Start: 2021-03-20 — End: 2021-03-20
  Administered 2021-03-20: 60 mg via ORAL
  Filled 2021-03-20: qty 3

## 2021-03-20 MED ORDER — PREDNISONE 10 MG PO TABS: 20.0000 mg | ORAL_TABLET | Freq: Every day | ORAL | 0 refills | Status: DC

## 2021-03-20 NOTE — Discharge Instructions (Addendum)
Use the inhaler 2 puffs at a time, every 4 hours as needed.  Try to stop vaping.  Follow-up with the pulmonologist.  Return to the emergency department if you are having any problems.

## 2021-03-20 NOTE — ED Provider Notes (Signed)
[facility] EMERGENCY DEPARTMENT Provider Note   CSN: [id number] Arrival date & time: 03/19/21  2344     History  Chief Complaint  Patient presents with   Chest Pain    Steven Diaz is a 26 y.o. male.  The history is provided by the patient.  Chest Pain He has history of hypertension, posttraumatic stress disorder and comes in because of chest pain and dyspnea.  Symptoms have actually been present since mid November, but he was awakened this morning with chest pain and difficulty breathing.  Chest discomfort is described as a heavy, tight feeling across his anterior chest which she rates at 6/10.  Dyspnea is worse with exertion, but nothing makes it better.  He denies nausea, vomiting, diaphoresis.  There is no cough.  He has been evaluated in the emergency department and has seen a cardiologist with no definite diagnosis made.  He did not take his blood pressure medication today because he was feeling bad.  He did notice some tingling in his fingers.  He quit smoking 1 year ago, but continues to vape.  There is no history of diabetes or hyperlipidemia.   Home Medications Prior to Admission medications   Medication Sig Start Date End Date Taking? Authorizing Provider  albuterol (VENTOLIN HFA) 108 (90 Base) MCG/ACT inhaler Inhale 2 puffs into the lungs every 6 (six) hours as needed for wheezing or shortness of breath. 02/13/20   Elson Areas, PA-C  amLODipine (NORVASC) 5 MG tablet Take 1 tablet (5 mg total) by mouth daily for 30 doses. 03/04/21 04/03/21  Terald Sleeper, MD  budesonide-formoterol (SYMBICORT) 80-4.5 MCG/ACT inhaler Inhale 2 puffs into the lungs daily for 1 dose. 02/13/20 02/14/20  Elson Areas, PA-C  FLUoxetine (PROZAC) 20 MG capsule Take 1 capsule (20 mg total) by mouth daily. 06/13/20 06/13/21  Charm Rings, NP  gabapentin (NEURONTIN) 100 MG capsule Take 1 capsule (100 mg total) by mouth 3 (three) times daily. 06/13/20 06/13/21  Charm Rings, NP   ibuprofen (ADVIL) 600 MG tablet Take 1 tablet (600 mg total) by mouth every 6 (six) hours as needed. 10/04/20   Domenick Gong, MD  metoprolol tartrate (LOPRESSOR) 100 MG tablet 1 TAB NIGHT BEFORE CT AND 1 TAB 2 HOURS BEFORE CT 01/04/21   Croitoru, Mihai, MD  oxyCODONE-acetaminophen (PERCOCET) 10-325 MG tablet Take 1 tablet by mouth every 6 (six) hours as needed for up to 12 doses for pain. Patient not taking: Reported on 01/04/2021 09/26/20   Cheryll Cockayne, MD  tiZANidine (ZANAFLEX) 4 MG tablet Take 1 tablet (4 mg total) by mouth every 6 (six) hours as needed for up to 15 doses for muscle spasms. Patient not taking: Reported on 01/04/2021 09/26/20   Cheryll Cockayne, MD      Allergies    Perflutren and Risperidone    Review of Systems   Review of Systems  Cardiovascular:  Positive for chest pain.  All other systems reviewed and are negative.  Physical Exam Updated Vital Signs BP (!) 141/91    Pulse 92    Temp 98.5 F (36.9 C)    Resp 16    Ht 6\' 3"  (1.905 m)    Wt (!) 176.9 kg    SpO2 98%    BMI 48.75 kg/m  Physical Exam Vitals and nursing note reviewed.  Somewhat anxious 26year old male, resting comfortably and in no acute distress. Vital signs are significant for borderline elevated blood pressure. Oxygen saturation is  98%, which is normal. Head is normocephalic and atraumatic. PERRLA, EOMI. Oropharynx is clear. Neck is nontender and supple without adenopathy or JVD. Back is nontender and there is no CVA tenderness. Lungs are clear without rales, wheezes, or rhonchi.  There is a slight prolongation of exhalation phase Chest is nontender. Heart has regular rate and rhythm without murmur. Abdomen is soft, flat, nontender. Extremities have travce edema, full range of motion is present. Skin is warm and dry without rash. Neurologic: Mental status is normal, cranial nerves are intact, moves all extremities equally.  ED Results / Procedures / Treatments   Labs (all labs ordered are  listed, but only abnormal results are displayed) Labs Reviewed  COMPREHENSIVE METABOLIC PANEL - Abnormal; Notable for the following components:      Result Value   Glucose, Bld 124 (*)    ALT 47 (*)    All other components within normal limits  CBC WITH DIFFERENTIAL/PLATELET - Abnormal; Notable for the following components:   WBC 10.6 (*)    MCH 25.9 (*)    Lymphs Abs 4.5 (*)    All other components within normal limits  BRAIN NATRIURETIC PEPTIDE  TROPONIN I (HIGH SENSITIVITY)  TROPONIN I (HIGH SENSITIVITY)    EKG EKG Interpretation  Date/Time:  Sunday March 19 2021 23:50:53 EST Ventricular Rate:  89 PR Interval:  173 QRS Duration: 110 QT Interval:  334 QTC Calculation: 407 R Axis:   30 Text Interpretation: Sinus rhythm Low voltage, precordial leads RSR' in V1 or V2, right VCD or RVH When compared with ECG of 03/03/2021, No significant change was found Confirmed by Dione Booze ([id number]) on 03/20/2021 12:01:10 AM  Radiology DG Chest 2 View  Result Date: 03/20/2021 CLINICAL DATA:  Chest pain, shortness of breath EXAM: CHEST - 2 VIEW COMPARISON:  03/03/2021 FINDINGS: Heart and mediastinal contours are within normal limits. No focal opacities or effusions. No acute bony abnormality. IMPRESSION: No active cardiopulmonary disease. Electronically Signed   By: Charlett Nose M.D.   On: 03/20/2021 00:44    Procedures Procedures  Cardiac monitor, per my interpretation, shows normal sinus rhythm.  Medications Ordered in ED Medications  albuterol (VENTOLIN HFA) 108 (90 Base) MCG/ACT inhaler 2 puff (has no administration in time range)  ipratropium-albuterol (DUONEB) 0.5-2.5 (3) MG/3ML nebulizer solution 3 mL (3 mLs Nebulization Given 03/20/21 0050)  ipratropium-albuterol (DUONEB) 0.5-2.5 (3) MG/3ML nebulizer solution 3 mL (3 mLs Nebulization Given 03/20/21 0226)  predniSONE (DELTASONE) tablet 60 mg (60 mg Oral Given 03/20/21 0226)    ED Course/ Medical Decision Making/ A&P                            Medical Decision Making Amount and/or Complexity of Data Reviewed Labs: ordered. Radiology: ordered.  Risk Prescription drug management.   Chest discomfort and dyspnea of uncertain cause.  Old records are reviewed, and he was evaluated by a cardiologist on 11/30 with no diagnosis made.  Echocardiogram was obtained which showed normal ejection fraction and normal diastolic parameters.  He was seen in the ED on 1/27 and had a CT angiogram of the chest which showed no evidence of pulmonary emboli, no evidence of intrinsic pulmonary disease.  It is noted that he has albuterol inhaler on his medication list, the patient states that he never used it for any of his current symptoms and ran out of the inhaler about 3 weeks ago.  He does have a prolonged exhalation phase on  exam, I wonder whether this may actually be from his underlying asthma.  We will check chest x-ray to rule out interval development of pneumonia, check BNP to rule out heart failure, troponin to rule out occult ACS.  ECG showed no acute changes, unchanged from prior.  With recent negative CT angiogram of the chest, I do not feel there is a need to screen again with a D-dimer to rule out pulmonary embolism.  Labs show minimal elevation of AST which is not felt to be clinically significant and is unchanged from prior.  Chest x-ray shows no evidence of pneumonia or heart failure.  I have independently viewed the images, and agree with the radiologist's interpretation.  He states that following albuterol with ipratropium he feels like his chest is more open but he still has a feeling of something sitting on his chest.  We will repeat albuterol with ipratropium and give initial dose of prednisone.  He noted further improvement with second nebulizer treatment.  At this point, he was also noticing some improvement in the heaviness in his chest.  I feel symptoms are likely secondary to bronchospasm.  He is discharged with an albuterol  inhaler and a prescription for a 5-day course of prednisone.  He is referred to pulmonology for further outpatient evaluation and treatment.        Final Clinical Impression(s) / ED Diagnoses Final diagnoses:  Chest discomfort    Rx / DC Orders ED Discharge Orders          Ordered    predniSONE (DELTASONE) 10 MG tablet  Daily        03/20/21 0326    Ambulatory referral to Pulmonology        03/20/21 0326              Dione Booze, MD 03/20/21 0330

## 2021-03-20 NOTE — Telephone Encounter (Signed)
CSW contacted patient to inform of PCP appointment at [facility] for 03-29-21 at 10:50 am. Patient verbalizes understanding and will follow up with appointment. Raquel Sarna, [place], [place]

## 2021-03-29 ENCOUNTER — Encounter: Payer: Self-pay | Admitting: Nurse Practitioner

## 2021-03-29 ENCOUNTER — Other Ambulatory Visit: Payer: Self-pay

## 2021-03-29 ENCOUNTER — Ambulatory Visit: Payer: Self-pay | Attending: Nurse Practitioner | Admitting: Nurse Practitioner

## 2021-03-29 VITALS — BP 117/77 | HR 97 | Resp 18 | Ht 75.0 in | Wt >= 6400 oz

## 2021-03-29 DIAGNOSIS — G8929 Other chronic pain: Secondary | ICD-10-CM

## 2021-03-29 DIAGNOSIS — Z7689 Persons encountering health services in other specified circumstances: Secondary | ICD-10-CM

## 2021-03-29 DIAGNOSIS — I10 Essential (primary) hypertension: Secondary | ICD-10-CM

## 2021-03-29 DIAGNOSIS — M545 Low back pain, unspecified: Secondary | ICD-10-CM

## 2021-03-29 MED ORDER — METHOCARBAMOL 500 MG PO TABS: 500.0000 mg | ORAL_TABLET | Freq: Three times a day (TID) | ORAL | 1 refills | Status: DC | PRN

## 2021-03-29 MED ORDER — MELOXICAM 15 MG PO TABS: 15.0000 mg | ORAL_TABLET | Freq: Every day | ORAL | 3 refills | Status: AC

## 2021-03-29 MED ORDER — METHOCARBAMOL 750 MG PO TABS: 750.0000 mg | ORAL_TABLET | Freq: Three times a day (TID) | ORAL | 1 refills | Status: AC | PRN

## 2021-03-29 MED ORDER — AMLODIPINE BESYLATE 5 MG PO TABS: 5.0000 mg | ORAL_TABLET | Freq: Every day | ORAL | 1 refills | Status: DC

## 2021-03-29 NOTE — Progress Notes (Signed)
Assessment & Plan:  Steven Diaz was seen today for establish care.  Diagnoses and all orders for this visit:  Encounter to establish care  Primary hypertension -     amLODipine (NORVASC) 5 MG tablet; Take 1 tablet (5 mg total) by mouth daily for 90 doses.  Chronic bilateral low back pain without sciatica -     meloxicam (MOBIC) 15 MG tablet; Take 1 tablet (15 mg total) by mouth daily. -     methocarbamol (ROBAXIN) 750 MG tablet; Take 1 tablet (750 mg total) by mouth every 8 (eight) hours as needed for muscle spasms. His weight is an issue with BMI 52 Work on losing weight to help reduce back pain. May alternate with heat and ice application for pain relief. May also alternate with acetaminophen and Ibuprofen as prescribed for back pain. Other alternatives include massage, acupuncture and water aerobics.  You must stay active and avoid a sedentary lifestyle.     Patient has been counseled on age-appropriate routine health concerns for screening and prevention. These are reviewed and up-to-date. Referrals have been placed accordingly. Immunizations are up-to-date or declined.    Subjective:   Chief Complaint  Patient presents with   Establish Care   HPI Steven Diaz 26 y.o. male presents to office today to establish care as a new patient.  has a past medical history of Asthma, Bipolar 1 disorder (HCC), Hypertension, and PTSD (post-traumatic stress disorder).   CARDIAC He notes elevated blood pressures at home as well as palpitations at night.  Cardiac work-up in the emergency room has been essentially normal.  He may need an event monitor as outpatient however he is currently uninsured. Patient was urged to apply for the financial assistance program.  They were instructed to inquire at the front desk about the application process for the [place] discount, orange card or other financial assistance.      Anxiety and depression He has stopped taking his fluoxetine and gabapentin.   He reports they are needed.   Stop smoking cigarettes 1-1/2 years ago and currently vapes 4-5 times per day.    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   Sleep Apnea: Patient presents with possible obstructive sleep apnea. Patent has a several years history of symptoms of daytime fatigue, morning fatigue, and hypertension. Patient generally gets a few hours of sleep per night, and states they generally have difficulty falling asleep, nightime awakenings, and difficulty falling back asleep if awakened. Snoring of moderate severity is present. Apneic episodes are present. Nasal obstruction is not known to be present.    HTN Blood pressure is well controlled with amlodipine 5 g daily which she is taking as prescribed.  He does note higher readings at home.  I have instructed him to bring in his blood pressure cuff for correlation.    BP Readings from Last 3 Encounters:  03/29/21 117/77  03/20/21 134/74  03/04/21 (!) 142/83      Review of Systems  Constitutional:  Negative for fever, malaise/fatigue and weight loss.  HENT: Negative.  Negative for nosebleeds.   Eyes: Negative.  Negative for blurred vision, double vision and photophobia.  Respiratory: Negative.  Negative for cough and shortness of breath.   Cardiovascular:  Positive for palpitations. Negative for chest pain, orthopnea, claudication, leg swelling and PND.  Gastrointestinal: Negative.  Negative for heartburn, nausea and vomiting.  Musculoskeletal:  Positive for back pain. Negative for myalgias.  Skin:        Note skin tag  inner thigh which needs to be removed  Neurological: Negative.  Negative for dizziness, focal weakness, seizures and headaches.  Psychiatric/Behavioral:  Positive for depression. Negative for suicidal ideas. The patient is nervous/anxious and has insomnia.    Past Medical History:  Diagnosis Date   Asthma    Bipolar 1 disorder (HCC)    Hypertension    PTSD (post-traumatic stress disorder)      History reviewed. No pertinent surgical history.  Family History  Problem Relation Age of Onset   COPD Mother    Heart disease Mother    Cancer Mother    Heart disease Father    Diabetes Maternal Grandmother    Diabetes Maternal Grandfather    Diabetes Paternal Grandmother    Diabetes Paternal Grandfather     Social History Reviewed with no changes to be made today.   Outpatient Medications Prior to Visit  Medication Sig Dispense Refill   albuterol (VENTOLIN HFA) 108 (90 Base) MCG/ACT inhaler Inhale 2 puffs into the lungs every 6 (six) hours as needed for wheezing or shortness of breath. 8 g 2   amLODipine (NORVASC) 5 MG tablet Take 1 tablet (5 mg total) by mouth daily for 30 doses. 30 tablet 0   budesonide-formoterol (SYMBICORT) 80-4.5 MCG/ACT inhaler Inhale 2 puffs into the lungs daily for 1 dose. 1 each 2   FLUoxetine (PROZAC) 20 MG capsule Take 1 capsule (20 mg total) by mouth daily. 30 capsule 2   gabapentin (NEURONTIN) 100 MG capsule Take 1 capsule (100 mg total) by mouth 3 (three) times daily. 90 capsule 2   ibuprofen (ADVIL) 600 MG tablet Take 1 tablet (600 mg total) by mouth every 6 (six) hours as needed. 30 tablet 0   metoprolol tartrate (LOPRESSOR) 100 MG tablet 1 TAB NIGHT BEFORE CT AND 1 TAB 2 HOURS BEFORE CT 2 tablet 1   oxyCODONE-acetaminophen (PERCOCET) 10-325 MG tablet Take 1 tablet by mouth every 6 (six) hours as needed for up to 12 doses for pain. (Patient not taking: Reported on 01/04/2021) 12 tablet 0   predniSONE (DELTASONE) 10 MG tablet Take 2 tablets (20 mg total) by mouth daily. 15 tablet 0   tiZANidine (ZANAFLEX) 4 MG tablet Take 1 tablet (4 mg total) by mouth every 6 (six) hours as needed for up to 15 doses for muscle spasms. (Patient not taking: Reported on 01/04/2021) 15 tablet 0   No facility-administered medications prior to visit.    Allergies  Allergen Reactions   Perflutren Other (See Comments)    Back pain   Risperidone Other (See Comments)     Gynecomastia.        Objective:    BP 117/77    Pulse 97    Resp 18    Ht 6\' 3"  (1.905 m)    Wt (!) 423 lb 4 oz (192 kg)    SpO2 97%    BMI 52.90 kg/m  Wt Readings from Last 3 Encounters:  03/29/21 (!) 423 lb 4 oz (192 kg)  03/19/21 (!) 389 lb 15.9 oz (176.9 kg)  03/03/21 (!) 390 lb (176.9 kg)    Physical Exam Vitals and nursing note reviewed.  Constitutional:      Appearance: He is well-developed. He is obese.  HENT:     Head: Normocephalic and atraumatic.  Cardiovascular:     Rate and Rhythm: Normal rate and regular rhythm.     Heart sounds: Normal heart sounds. No murmur heard.   No friction rub. No gallop.  Pulmonary:  Effort: Pulmonary effort is normal. No tachypnea or respiratory distress.     Breath sounds: Normal breath sounds. No decreased breath sounds, wheezing, rhonchi or rales.  Chest:     Chest wall: No tenderness.  Abdominal:     General: Bowel sounds are normal.     Palpations: Abdomen is soft.  Musculoskeletal:        General: Normal range of motion.     Cervical back: Normal range of motion.  Skin:    General: Skin is warm and dry.  Neurological:     Mental Status: He is alert and oriented to person, place, and time.     Coordination: Coordination normal.  Psychiatric:        Behavior: Behavior normal. Behavior is cooperative.        Thought Content: Thought content normal.        Judgment: Judgment normal.         Patient has been counseled extensively about nutrition and exercise as well as the importance of adherence with medications and regular follow-up. The patient was given clear instructions to go to ER or return to medical center if symptoms don't improve, worsen or new problems develop. The patient verbalized understanding.   Follow-up: Return for physical .   Claiborne Rigg, FNP-BC [facility] [place], [place] [phone]   03/29/2021, 12:55 PM

## 2021-03-31 ENCOUNTER — Encounter: Payer: Self-pay | Admitting: Student

## 2021-03-31 ENCOUNTER — Other Ambulatory Visit: Payer: Self-pay

## 2021-03-31 ENCOUNTER — Ambulatory Visit (INDEPENDENT_AMBULATORY_CARE_PROVIDER_SITE_OTHER): Payer: Self-pay | Admitting: Student

## 2021-03-31 VITALS — BP 126/84 | HR 101 | Temp 98.4°F | Ht 75.0 in | Wt >= 6400 oz

## 2021-03-31 DIAGNOSIS — R0609 Other forms of dyspnea: Secondary | ICD-10-CM

## 2021-03-31 DIAGNOSIS — R0683 Snoring: Secondary | ICD-10-CM

## 2021-03-31 MED ORDER — ALBUTEROL SULFATE HFA 108 (90 BASE) MCG/ACT IN AERS: 2.0000 | INHALATION_SPRAY | Freq: Four times a day (QID) | RESPIRATORY_TRACT | 6 refills | Status: DC | PRN

## 2021-03-31 NOTE — Progress Notes (Signed)
Synopsis: Referred for dyspnea, chest discomfort by Dione Booze, MD  Subjective:   PATIENT ID: Steven Diaz GENDER: male DOB: Oct 24, 1995, MRN: [id number]  Chief Complaint  Patient presents with   Pulmonary Consult    Referred by Dr. Dione Booze.  Pt c/o DOE and low energy for at least the past 8 months. He gets winded walking short distances such as lobby to exam room. He is requesting to have sleep study done but holding off until he gets his health insurance. He snores and feels sleepy during the day.    26yM with history of asthma, BiPD, PTSD, HTN, severe obesity, covid-19 infection in 2021 referred for possible asthma  Seen in ED 2/13 where EDP suspected bronchospasm responsive to steroids, nebs. Dr. Preston Fleeting referred him to [place]. He says he did feel a little better after those breathing treatments. And does feel the albuterol is helpful. Wasn't able to afford the prednisone so he didn't take.   He does think prednisone has at least partially been helpful for his DOE in past for asthma attacks.   Has had DOE over last 8 months. Pretty minimal cough.  Has always had issues 'sleeping and breathing.' He has frequent PND. His mother has seen witnessed apneas. Snores loudly. Feels very sleepy during the day.   Previous inhalers: Albuterol Flovent  Otherwise pertinent review of systems is negative.  Mother with COPD, emphysema  Has been vaping but only with nicotine. Smoked cigarettes for 5-10 years half to 1 ppd. Worked as Lawyer, Engineering geologist, now works at [employer]. They have cats, rabbit. He has lived in [place], [place], [place].   Past Medical History:  Diagnosis Date   Asthma    Bipolar 1 disorder (HCC)    Hypertension    PTSD (post-traumatic stress disorder)      Family History  Problem Relation Age of Onset   COPD Mother    Heart disease Mother    Cancer Mother    Heart disease Father    Diabetes Maternal Grandmother    Diabetes Maternal Grandfather    Diabetes Paternal Grandmother     Diabetes Paternal Grandfather      No past surgical history on file.  Social History   Socioeconomic History   Marital status: Legally Separated    Spouse name: Not on file   Number of children: Not on file   Years of education: Not on file   Highest education level: Not on file  Occupational History   Not on file  Tobacco Use   Smoking status: Former    Packs/day: 1.00    Years: 15.00    Pack years: 15.00    Types: Cigarettes    Quit date: 02/12/2020    Years since quitting: 1.1   Smokeless tobacco: Never  Vaping Use   Vaping Use: Some days   Start date: 02/06/2020   Substances: Nicotine, Flavoring  Substance and Sexual Activity   Alcohol use: Not Currently   Drug use: Never   Sexual activity: Not on file  Other Topics Concern   Not on file  Social History Narrative   Not on file   Social Determinants of Health   Financial Resource Strain: Not on file  Food Insecurity: Not on file  Transportation Needs: Not on file  Physical Activity: Not on file  Stress: Not on file  Social Connections: Not on file  Intimate Partner Violence: Not on file     Allergies  Allergen Reactions   Perflutren Other (See  Comments)    Back pain   Risperidone Other (See Comments)    Gynecomastia.      Outpatient Medications Prior to Visit  Medication Sig Dispense Refill   albuterol (VENTOLIN HFA) 108 (90 Base) MCG/ACT inhaler Inhale 2 puffs into the lungs every 6 (six) hours as needed for wheezing or shortness of breath. 8 g 2   amLODipine (NORVASC) 5 MG tablet Take 1 tablet (5 mg total) by mouth daily for 90 doses. 90 tablet 1   meloxicam (MOBIC) 15 MG tablet Take 1 tablet (15 mg total) by mouth daily. 30 tablet 3   methocarbamol (ROBAXIN) 750 MG tablet Take 1 tablet (750 mg total) by mouth every 8 (eight) hours as needed for muscle spasms. 120 tablet 1   No facility-administered medications prior to visit.       Objective:   Physical Exam:  General appearance: 26 y.o.,  male, NAD, conversant  Eyes: anicteric sclerae; PERRL, tracking appropriately HENT: NCAT; MMM Neck: Trachea midline; no lymphadenopathy, no JVD Lungs: CTAB, no crackles, no wheeze, with normal respiratory effort CV: RRR, no murmur  Abdomen: Soft, non-tender; non-distended, BS present  Extremities: No peripheral edema, warm Skin: Normal turgor and texture; no rash Psych: Appropriate affect Neuro: Alert and oriented to person and place, no focal deficit     Vitals:   03/31/21 1510  BP: 126/84  Pulse: (!) 101  Temp: 98.4 F (36.9 C)  TempSrc: Oral  SpO2: 97%  Weight: (!) 425 lb (192.8 kg)  Height: 6\' 3"  (1.905 m)   97% on RA BMI Readings from Last 3 Encounters:  03/31/21 53.12 kg/m  03/29/21 52.90 kg/m  03/19/21 48.75 kg/m   Wt Readings from Last 3 Encounters:  03/31/21 (!) 425 lb (192.8 kg)  03/29/21 (!) 423 lb 4 oz (192 kg)  03/19/21 (!) 389 lb 15.9 oz (176.9 kg)     CBC    Component Value Date/Time   WBC 10.6 (H) 03/20/2021 0055   RBC 5.33 03/20/2021 0055   HGB 13.8 03/20/2021 0055   HCT 44.0 03/20/2021 0055   PLT 358 03/20/2021 0055   MCV 82.6 03/20/2021 0055   MCH 25.9 (L) 03/20/2021 0055   MCHC 31.4 03/20/2021 0055   RDW 14.3 03/20/2021 0055   LYMPHSABS 4.5 (H) 03/20/2021 0055   MONOABS 0.8 03/20/2021 0055   EOSABS 0.2 03/20/2021 0055   BASOSABS 0.0 03/20/2021 0055     Chest Imaging: CTA Chest 03/18/21 reviewed by me unremarkable save for habitus  Pulmonary Functions Testing Results: No flowsheet data found.   Echocardiogram:   TTE 03/14/21:   1. Left ventricular ejection fraction, by estimation, is 55 to 60%. The  left ventricle has normal function. The left ventricle has no regional  wall motion abnormalities. Left ventricular diastolic parameters were  normal.   2. Right ventricular systolic function is normal. The right ventricular  size is normal.   3. The mitral valve is normal in structure. No evidence of mitral valve   regurgitation. No evidence of mitral stenosis.   4. The aortic valve is normal in structure. Aortic valve regurgitation is  not visualized. No aortic stenosis is present.   5. The inferior vena cava is normal in size with greater than 50%  respiratory variability, suggesting right atrial pressure of 3 mmHg.       Assessment & Plan:   # DOE # History of asthma May be multifactorial with chart history of asthma - does at least have partial steroid/SABA response  but not wheezy on exam today, deconditioning, untreated OSA. If he has asthma then gerd may complicate control.   # Snoring # Excessive daytime sleepiness # Paroxysmal nocturnal dyspnea High probability OSA.  Plan: - home sleep study ordered - albuterol prn, refilled - pre/post BD spirometry, instructed to try to hold albuterol for 2 days before PFT - flonase if worsening sinonasal congestion/postnasal drainage - TLC measures for gerd discussed - side sleeping to minimize OSA, gerd  RTC 4-6 weeks with PFT    Omar Person, MD [facility] 03/31/2021 3:38 PM

## 2021-03-31 NOTE — Patient Instructions (Addendum)
- Breathing tests (PFTs) in 4-6 weeks on same day as next clinic appointment. Would stop albuterol for 2 days ideally before PFT. - Can continue albuterol inhaler 1-2 puffs every 4-6 hours as needed - flonase 1 spray each nostril if you develop cough or sinus congestion/drainage persist - reflux measures below - sleep on your side to minimize sleep apnea and reflux - see you soon!   Gastroesophageal Reflux Disease, Adult Gastroesophageal reflux (GER) happens when acid from the stomach flows up into the tube that connects the mouth and the stomach (esophagus). Normally, food travels down the esophagus and stays in the stomach to be digested. However, when a person has GER, food and stomach acid sometimes move back up into the esophagus. If this becomes a more serious problem, the person may be diagnosed with a disease called gastroesophageal reflux disease (GERD). GERD occurs when the reflux: Happens often. Causes frequent or severe symptoms. Causes problems such as damage to the esophagus. When stomach acid comes in contact with the esophagus, the acid may cause inflammation in the esophagus. Over time, GERD may create small holes (ulcers) in the lining of the esophagus. What are the causes? This condition is caused by a problem with the muscle between the esophagus and the stomach (lower esophageal sphincter, or LES). Normally, the LES muscle closes after food passes through the esophagus to the stomach. When the LES is weakened or abnormal, it does not close properly, and that allows food and stomach acid to go back up into the esophagus. The LES can be weakened by certain dietary substances, medicines, and medical conditions, including: Tobacco use. Pregnancy. Having a hiatal hernia. Alcohol use. Certain foods and beverages, such as coffee, chocolate, onions, and peppermint. What increases the risk? You are more likely to develop this condition if you: Have an increased body weight. Have a  connective tissue disorder. Take NSAIDs, such as ibuprofen. What are the signs or symptoms? Symptoms of this condition include: Heartburn. Difficult or painful swallowing and the feeling of having a lump in the throat. A bitter taste in the mouth. Bad breath and having a large amount of saliva. Having an upset or bloated stomach and belching. Chest pain. Different conditions can cause chest pain. Make sure you see your health care provider if you experience chest pain. Shortness of breath or wheezing. Ongoing (chronic) cough or a nighttime cough. Wearing away of tooth enamel. Weight loss. How is this diagnosed? This condition may be diagnosed based on a medical history and a physical exam. To determine if you have mild or severe GERD, your health care provider may also monitor how you respond to treatment. You may also have tests, including: A test to examine your stomach and esophagus with a small camera (endoscopy). A test that measures the acidity level in your esophagus. A test that measures how much pressure is on your esophagus. A barium swallow or modified barium swallow test to show the shape, size, and functioning of your esophagus. How is this treated? Treatment for this condition may vary depending on how severe your symptoms are. Your health care provider may recommend: Changes to your diet. Medicine. Surgery. The goal of treatment is to help relieve your symptoms and to prevent complications. Follow these instructions at home: Eating and drinking  Follow a diet as recommended by your health care provider. This may involve avoiding foods and drinks such as: Coffee and tea, with or without caffeine. Drinks that contain alcohol. Energy drinks and sports drinks.  Carbonated drinks or sodas. Chocolate and cocoa. Peppermint and mint flavorings. Garlic and onions. Horseradish. Spicy and acidic foods, including peppers, chili powder, curry powder, vinegar, hot sauces, and  barbecue sauce. Citrus fruit juices and citrus fruits, such as oranges, lemons, and limes. Tomato-based foods, such as red sauce, chili, salsa, and pizza with red sauce. Fried and fatty foods, such as donuts, french fries, potato chips, and high-fat dressings. High-fat meats, such as hot dogs and fatty cuts of red and white meats, such as rib eye steak, sausage, ham, and bacon. High-fat dairy items, such as whole milk, butter, and cream cheese. Eat small, frequent meals instead of large meals. Avoid drinking large amounts of liquid with your meals. Avoid eating meals during the 2-3 hours before bedtime. Avoid lying down right after you eat. Do not exercise right after you eat. Lifestyle  Do not use any products that contain nicotine or tobacco. These products include cigarettes, chewing tobacco, and vaping devices, such as e-cigarettes. If you need help quitting, ask your health care provider. Try to reduce your stress by using methods such as yoga or meditation. If you need help reducing stress, ask your health care provider. If you are overweight, reduce your weight to an amount that is healthy for you. Ask your health care provider for guidance about a safe weight loss goal. General instructions Pay attention to any changes in your symptoms. Take over-the-counter and prescription medicines only as told by your health care provider. Do not take aspirin, ibuprofen, or other NSAIDs unless your health care provider told you to take these medicines. Wear loose-fitting clothing. Do not wear anything tight around your waist that causes pressure on your abdomen. Raise (elevate) the head of your bed about 6 inches (15 cm). You can use a wedge to do this. Avoid bending over if this makes your symptoms worse. Keep all follow-up visits. This is important. Contact a health care provider if: You have: New symptoms. Unexplained weight loss. Difficulty swallowing or it hurts to swallow. Wheezing or a  persistent cough. A hoarse voice. Your symptoms do not improve with treatment. Get help right away if: You have sudden pain in your arms, neck, jaw, teeth, or back. You suddenly feel sweaty, dizzy, or light-headed. You have chest pain or shortness of breath. You vomit and the vomit is green, yellow, or black, or it looks like blood or coffee grounds. You faint. You have stool that is red, bloody, or black. You cannot swallow, drink, or eat. These symptoms may represent a serious problem that is an emergency. Do not wait to see if the symptoms will go away. Get medical help right away. Call your local emergency services (911 in the [place] Do not drive yourself to the hospital. Summary Gastroesophageal reflux happens when acid from the stomach flows up into the esophagus. GERD is a disease in which the reflux happens often, causes frequent or severe symptoms, or causes problems such as damage to the esophagus. Treatment for this condition may vary depending on how severe your symptoms are. Your health care provider may recommend diet and lifestyle changes, medicine, or surgery. Contact a health care provider if you have new or worsening symptoms. Take over-the-counter and prescription medicines only as told by your health care provider. Do not take aspirin, ibuprofen, or other NSAIDs unless your health care provider told you to do so. Keep all follow-up visits as told by your health care provider. This is important. This information is not intended to replace  advice given to you by your health care provider. Make sure you discuss any questions you have with your health care provider. Document Revised: 08/03/2019 Document Reviewed: 08/03/2019 Elsevier Patient Education  2022 [employer].

## 2021-04-24 ENCOUNTER — Encounter: Payer: Self-pay | Admitting: Nurse Practitioner

## 2021-05-10 ENCOUNTER — Ambulatory Visit: Payer: Medicaid Other

## 2021-05-10 DIAGNOSIS — R0683 Snoring: Secondary | ICD-10-CM

## 2021-05-11 ENCOUNTER — Ambulatory Visit: Payer: Self-pay

## 2021-05-11 DIAGNOSIS — G4733 Obstructive sleep apnea (adult) (pediatric): Secondary | ICD-10-CM

## 2021-05-17 ENCOUNTER — Ambulatory Visit (INDEPENDENT_AMBULATORY_CARE_PROVIDER_SITE_OTHER): Payer: Self-pay | Admitting: Student

## 2021-05-17 ENCOUNTER — Encounter: Payer: Self-pay | Admitting: Student

## 2021-05-17 VITALS — BP 134/78 | HR 92 | Temp 98.0°F | Ht 75.0 in | Wt >= 6400 oz

## 2021-05-17 DIAGNOSIS — R0609 Other forms of dyspnea: Secondary | ICD-10-CM

## 2021-05-17 DIAGNOSIS — R0683 Snoring: Secondary | ICD-10-CM

## 2021-05-17 LAB — PULMONARY FUNCTION TEST
DL/VA % pred: 133 %
DL/VA: 6.49 ml/min/mmHg/L
DLCO cor % pred: 88 %
DLCO cor: 33.92 ml/min/mmHg
DLCO unc % pred: 88 %
DLCO unc: 33.92 ml/min/mmHg
FEF 25-75 Post: 4.14 L/sec
FEF 25-75 Pre: 3.3 L/sec
FEF2575-%Change-Post: 25 %
FEF2575-%Pred-Post: 81 %
FEF2575-%Pred-Pre: 65 %
FEV1-%Change-Post: 5 %
FEV1-%Pred-Post: 67 %
FEV1-%Pred-Pre: 63 %
FEV1-Post: 3.47 L
FEV1-Pre: 3.3 L
FEV1FVC-%Change-Post: 2 %
FEV1FVC-%Pred-Pre: 100 %
FEV6-%Change-Post: 3 %
FEV6-%Pred-Post: 65 %
FEV6-%Pred-Pre: 63 %
FEV6-Post: 4.1 L
FEV6-Pre: 3.98 L
FEV6FVC-%Pred-Post: 101 %
FEV6FVC-%Pred-Pre: 101 %
FVC-%Change-Post: 3 %
FVC-%Pred-Post: 64 %
FVC-%Pred-Pre: 62 %
FVC-Post: 4.1 L
FVC-Pre: 3.98 L
Post FEV1/FVC ratio: 85 %
Post FEV6/FVC ratio: 100 %
Pre FEV1/FVC ratio: 83 %
Pre FEV6/FVC Ratio: 100 %
RV % pred: 98 %
RV: 1.83 L
TLC % pred: 74 %
TLC: 5.92 L

## 2021-05-17 NOTE — Patient Instructions (Signed)
Full PFT performed today. °

## 2021-05-17 NOTE — Progress Notes (Signed)
Full PFT performed today. °

## 2021-05-17 NOTE — Patient Instructions (Signed)
-   if you haven't heard from [place] in 2 weeks about sleep study then send [place] a message or call ?- we will set up follow up appointment in a month to discuss sleep study results.  ?

## 2021-05-17 NOTE — Progress Notes (Signed)
? ?Synopsis: Referred for dyspnea, chest discomfort by Claiborne Rigg, NP ? ?Subjective:  ? ?PATIENT ID: Steven Diaz GENDER: male DOB: 09/27/1995, MRN: [id number] ? ?Chief Complaint  ?Patient presents with  ? Follow-up  ?  PFT's today. Breathing is unchanged.   ? ?26yM with history of asthma, BiPD, PTSD, HTN, severe obesity, covid-19 infection in 2021 referred for possible asthma ? ?Seen in ED 2/13 where EDP suspected bronchospasm responsive to steroids, nebs. Dr. Preston Fleeting referred him to [place]. He says he did feel a little better after those breathing treatments. And does feel the albuterol is helpful. Wasn't able to afford the prednisone so he didn't take.  ? ?He does think prednisone has at least partially been helpful for his DOE in past for asthma attacks.  ? ?Has had DOE over last 8 months. Pretty minimal cough. ? ?Has always had issues 'sleeping and breathing.' He has frequent PND. His mother has seen witnessed apneas. Snores loudly. Feels very sleepy during the day.  ? ?Previous inhalers: ?Albuterol ?Flovent ? ?Mother with COPD, emphysema ? ?Has been vaping but only with nicotine. Smoked cigarettes for 5-10 years half to 1 ppd. Worked as Lawyer, Engineering geologist, now works at [employer]. They have cats, rabbit. He has lived in [place], [place], [place].  ? ?Interval HPI: ?PFTs today ? ?He says that when he exerts himself he has some wheeze, cough, chest tightness, dyspnea.  ? ?Otherwise pertinent review of systems is negative. ? ?Past Medical History:  ?Diagnosis Date  ? Asthma   ? Bipolar 1 disorder (HCC)   ? Hypertension   ? PTSD (post-traumatic stress disorder)   ?  ? ?Family History  ?Problem Relation Age of Onset  ? COPD Mother   ? Heart disease Mother   ? Cancer Mother   ? Heart disease Father   ? Diabetes Maternal Grandmother   ? Diabetes Maternal Grandfather   ? Diabetes Paternal Grandmother   ? Diabetes Paternal Grandfather   ?  ? ?No past surgical history on file. ? ?Social History  ? ?Socioeconomic History  ? Marital  status: Legally Separated  ?  Spouse name: Not on file  ? Number of children: Not on file  ? Years of education: Not on file  ? Highest education level: Not on file  ?Occupational History  ? Not on file  ?Tobacco Use  ? Smoking status: Former  ?  Packs/day: 1.00  ?  Years: 15.00  ?  Pack years: 15.00  ?  Types: Cigarettes  ?  Quit date: 02/12/2020  ?  Years since quitting: 1.2  ? Smokeless tobacco: Never  ?Vaping Use  ? Vaping Use: Some days  ? Start date: 02/06/2020  ? Substances: Nicotine, Flavoring  ?Substance and Sexual Activity  ? Alcohol use: Not Currently  ? Drug use: Never  ? Sexual activity: Not on file  ?Other Topics Concern  ? Not on file  ?Social History Narrative  ? Not on file  ? ?Social Determinants of Health  ? ?Financial Resource Strain: Not on file  ?Food Insecurity: Not on file  ?Transportation Needs: Not on file  ?Physical Activity: Not on file  ?Stress: Not on file  ?Social Connections: Not on file  ?Intimate Partner Violence: Not on file  ?  ? ?Allergies  ?Allergen Reactions  ? Perflutren Other (See Comments)  ?  Back pain  ? Risperidone Other (See Comments)  ?  Gynecomastia.   ?  ? ?Outpatient Medications Prior to Visit  ?Medication Sig  Dispense Refill  ? albuterol (VENTOLIN HFA) 108 (90 Base) MCG/ACT inhaler Inhale 2 puffs into the lungs every 6 (six) hours as needed for wheezing or shortness of breath. 8 g 6  ? amLODipine (NORVASC) 5 MG tablet Take 1 tablet (5 mg total) by mouth daily for 90 doses. (Patient not taking: Reported on 05/17/2021) 90 tablet 1  ? ?No facility-administered medications prior to visit.  ? ? ? ? ? ?Objective:  ? ?Physical Exam: ? ?General appearance: 26 y.o., male male, NAD, conversant  ?Eyes: anicteric sclerae; PERRL, tracking appropriately ?HENT: NCAT; MMM ?Neck: Trachea midline; no lymphadenopathy, no JVD ?Lungs: CTAB, no crackles, no wheeze, with normal respiratory effort ?CV: RRR, no murmur  ?Abdomen: Soft, non-tender; non-distended, BS present  ?Extremities: No peripheral  edema, warm ?Skin: Normal turgor and texture; no rash ?Psych: Appropriate affect ?Neuro: Alert and oriented to person and place, no focal deficit  ? ? ? ?Vitals:  ? 05/17/21 1506  ?BP: 134/78  ?Pulse: 92  ?Temp: 98 ?F (36.7 ?C)  ?TempSrc: Oral  ?SpO2: 95%  ?Weight: (!) 423 lb (191.9 kg)  ?Height: 6\' 3"  (1.905 m)  ? ? ?95% on RA ?BMI Readings from Last 3 Encounters:  ?05/17/21 52.87 kg/m?  ?03/31/21 53.12 kg/m?  ?03/29/21 52.90 kg/m?  ? ?Wt Readings from Last 3 Encounters:  ?05/17/21 (!) 423 lb (191.9 kg)  ?03/31/21 (!) 425 lb (192.8 kg)  ?03/29/21 (!) 423 lb 4 oz (192 kg)  ? ? ? ?CBC ?   ?Component Value Date/Time  ? WBC 10.6 (H) 03/20/2021 0055  ? RBC 5.33 03/20/2021 0055  ? HGB 13.8 03/20/2021 0055  ? HCT 44.0 03/20/2021 0055  ? PLT 358 03/20/2021 0055  ? MCV 82.6 03/20/2021 0055  ? MCH 25.9 (L) 03/20/2021 0055  ? MCHC 31.4 03/20/2021 0055  ? RDW 14.3 03/20/2021 0055  ? LYMPHSABS 4.5 (H) 03/20/2021 0055  ? MONOABS 0.8 03/20/2021 0055  ? EOSABS 0.2 03/20/2021 0055  ? BASOSABS 0.0 03/20/2021 0055  ? ? ? ?Chest Imaging: ?CTA Chest 03/18/21 reviewed by me unremarkable save for habitus ? ?Pulmonary Functions Testing Results: ? ?  Latest Ref Rng & Units 05/17/2021  ?  2:20 PM  ?PFT Results  ?FVC-Pre L 3.98  P  ?FVC-Predicted Pre % 62  P  ?FVC-Post L 4.10  P  ?FVC-Predicted Post % 64  P  ?Pre FEV1/FVC % % 83  P  ?Post FEV1/FCV % % 85  P  ?FEV1-Pre L 3.30  P  ?FEV1-Predicted Pre % 63  P  ?FEV1-Post L 3.47  P  ?DLCO uncorrected ml/min/mmHg 33.92  P  ?DLCO UNC% % 88  P  ?DLCO corrected ml/min/mmHg 33.92  P  ?DLCO COR %Predicted % 88  P  ?DLVA Predicted % 133  P  ?TLC L 5.92  P  ?TLC % Predicted % 74  P  ?RV % Predicted % 98  P  ?  ?P Preliminary result  ? ?Reviewed by me with moderate restriction ? ?Echocardiogram:  ? ?TTE 03/14/21: ? ? 1. Left ventricular ejection fraction, by estimation, is 55 to 60%. The  ?left ventricle has normal function. The left ventricle has no regional  ?wall motion abnormalities. Left ventricular  diastolic parameters were  ?normal.  ? 2. Right ventricular systolic function is normal. The right ventricular  ?size is normal.  ? 3. The mitral valve is normal in structure. No evidence of mitral valve  ?regurgitation. No evidence of mitral stenosis.  ? 4. The aortic valve  is normal in structure. Aortic valve regurgitation is  ?not visualized. No aortic stenosis is present.  ? 5. The inferior vena cava is normal in size with greater than 50%  ?respiratory variability, suggesting right atrial pressure of 3 mmHg.  ? ? ?   ?Assessment & Plan:  ? ?# DOE ?# History of asthma ?May be multifactorial with chart history of asthma - does at least have partial steroid/SABA response but not wheezy on exam today and pre/post BD spiro aren't suggestive of it, deconditioning, untreated OSA. If he has asthma then gerd may complicate control.  ? ?# Snoring ?# Excessive daytime sleepiness ?# Paroxysmal nocturnal dyspnea ?High probability OSA. ? ?Plan: ?- home sleep study pending ?- albuterol prn ?- flonase if worsening sinonasal congestion/postnasal drainage ?- TLC measures for gerd have been discussed ?- side sleeping to minimize OSA, gerd ? ?RTC 4 weeks to review sleep study, decide on empiric ICS/LABA trial vs methacholine challenge unless it clearly seems like severe OSA the dominant issue based on sleep study results ? ? ? ?Steven PersonNathaniel M Joie Hipps, MD ?[facility] ?05/17/2021 6:36 PM  ? ?

## 2021-05-22 DIAGNOSIS — G4733 Obstructive sleep apnea (adult) (pediatric): Secondary | ICD-10-CM

## 2021-05-24 ENCOUNTER — Other Ambulatory Visit (HOSPITAL_COMMUNITY)
Admission: RE | Admit: 2021-05-24 | Discharge: 2021-05-24 | Disposition: A | Discharge status: Home / Self Care | Payer: Medicaid Other | Source: Ambulatory Visit | Attending: Nurse Practitioner | Admitting: Nurse Practitioner

## 2021-05-24 ENCOUNTER — Encounter: Payer: Self-pay | Admitting: Nurse Practitioner

## 2021-05-24 ENCOUNTER — Ambulatory Visit: Payer: Medicaid Other | Attending: Nurse Practitioner | Admitting: Nurse Practitioner

## 2021-05-24 VITALS — BP 115/80 | HR 77 | Wt >= 6400 oz

## 2021-05-24 DIAGNOSIS — Z113 Encounter for screening for infections with a predominantly sexual mode of transmission: Secondary | ICD-10-CM | POA: Diagnosis present

## 2021-05-24 DIAGNOSIS — Z23 Encounter for immunization: Secondary | ICD-10-CM

## 2021-05-24 DIAGNOSIS — Z114 Encounter for screening for human immunodeficiency virus [HIV]: Secondary | ICD-10-CM

## 2021-05-24 DIAGNOSIS — J4520 Mild intermittent asthma, uncomplicated: Secondary | ICD-10-CM

## 2021-05-24 DIAGNOSIS — E559 Vitamin D deficiency, unspecified: Secondary | ICD-10-CM

## 2021-05-24 DIAGNOSIS — Z0001 Encounter for general adult medical examination with abnormal findings: Secondary | ICD-10-CM

## 2021-05-24 DIAGNOSIS — R5382 Chronic fatigue, unspecified: Secondary | ICD-10-CM

## 2021-05-24 DIAGNOSIS — F4310 Post-traumatic stress disorder, unspecified: Secondary | ICD-10-CM

## 2021-05-24 DIAGNOSIS — I10 Essential (primary) hypertension: Secondary | ICD-10-CM

## 2021-05-24 DIAGNOSIS — Z1159 Encounter for screening for other viral diseases: Secondary | ICD-10-CM

## 2021-05-24 DIAGNOSIS — Z0000 Encounter for general adult medical examination without abnormal findings: Secondary | ICD-10-CM

## 2021-05-24 MED ORDER — ALBUTEROL SULFATE HFA 108 (90 BASE) MCG/ACT IN AERS: 2.0000 | INHALATION_SPRAY | Freq: Four times a day (QID) | RESPIRATORY_TRACT | 1 refills | Status: DC | PRN

## 2021-05-24 MED ORDER — HYDROXYZINE HCL 10 MG PO TABS: 10.0000 mg | ORAL_TABLET | Freq: Three times a day (TID) | ORAL | 1 refills | Status: DC | PRN

## 2021-05-24 NOTE — Progress Notes (Signed)
? ?Assessment & Plan:  ?[name] was seen today for annual exam. ? ?Diagnoses and all orders for this visit: ? ?Encounter for annual physical exam ? ?Primary hypertension ?Continue to monitor blood pressure at home and report persistent readings >140/90 ? ?Vitamin D deficiency disease ?-     VITAMIN D 25 Hydroxy (Vit-D Deficiency, Fractures) ? ?Chronic fatigue ?-     Vitamin B12 ? ?Encounter for screening for HIV ?-     HIV antibody (with reflex) ? ?Need for hepatitis C screening test ?-     HCV Ab w Reflex to Quant PCR ?-     Interpretation: ? ?Screen for STD (sexually transmitted disease) ?-     HSV(herpes simplex vrs) 1+2 ab-IgG ?-     Urine cytology ancillary only ? ?Posttraumatic stress disorder ?-     hydrOXYzine (ATARAX) 10 MG tablet; Take 1 tablet (10 mg total) by mouth 3 (three) times daily as needed. NEEDS PASS ? ?Need for Tdap vaccination ?-     Tdap vaccine greater than or equal to 7yo IM ? ?Mild intermittent asthma without complication ?Well controlled ?-     albuterol (VENTOLIN HFA) 108 (90 Base) MCG/ACT inhaler; Inhale 2 puffs into the lungs every 6 (six) hours as needed for wheezing or shortness of breath. NEEDS PASS ? ? ? ?Patient has been counseled on age-appropriate routine health concerns for screening and prevention. These are reviewed and up-to-date. Referrals have been placed accordingly. Immunizations are up-to-date or declined.    ?Subjective:  ? ?Chief Complaint  ?Patient presents with  ? Annual Exam  ? ?HPI ?Steven Diaz 26 y.o. male presents to office today for annual physical.  ?He has a past medical history of Asthma, Bipolar 1 disorder ([place]), Hypertension, and PTSD (post-traumatic stress disorder).  ? ?Notes ringing in both ears.  ? ?Would like to restart hydroxyzine for anxiety.  ? ?Would like STI testing. Currently abstinent.  ? ?HTN ?Blood pressure well controlled. He is not currently taking amlodipine at this time.  ?BP Readings from Last 3 Encounters:  ?05/24/21 115/80   ?05/17/21 134/78  ?03/31/21 126/84  ?  ? ? ?BP Readings from Last 3 Encounters:  ?05/24/21 115/80  ?05/17/21 134/78  ?03/31/21 126/84  ?  ? ?Review of Systems  ?Constitutional:  Positive for malaise/fatigue. Negative for fever and weight loss.  ?HENT: Negative.  Negative for nosebleeds.   ?Eyes: Negative.  Negative for blurred vision, double vision and photophobia.  ?Respiratory: Negative.  Negative for cough and shortness of breath.   ?Cardiovascular: Negative.  Negative for chest pain, palpitations and leg swelling.  ?Gastrointestinal: Negative.  Negative for heartburn, nausea and vomiting.  ?Genitourinary: Negative.   ?Musculoskeletal: Negative.  Negative for myalgias.  ?Skin: Negative.   ?Neurological: Negative.  Negative for dizziness, focal weakness, seizures and headaches.  ?Endo/Heme/Allergies: Negative.   ?Psychiatric/Behavioral:  Positive for depression. Negative for suicidal ideas. The patient is nervous/anxious.   ? ?Past Medical History:  ?Diagnosis Date  ? Asthma   ? Bipolar 1 disorder ([place])   ? Hypertension   ? PTSD (post-traumatic stress disorder)   ? ? ?History reviewed. No pertinent surgical history. ? ?Family History  ?Problem Relation Age of Onset  ? COPD Mother   ? Heart disease Mother   ? Cancer Mother   ? Heart disease Father   ? Diabetes Maternal Grandmother   ? Diabetes Maternal Grandfather   ? Diabetes Paternal Grandmother   ? Diabetes Paternal Grandfather   ? ? ?Social History  Reviewed with no changes to be made today.  ? ?Outpatient Medications Prior to Visit  ?Medication Sig Dispense Refill  ? albuterol (VENTOLIN HFA) 108 (90 Base) MCG/ACT inhaler Inhale 2 puffs into the lungs every 6 (six) hours as needed for wheezing or shortness of breath. 8 g 6  ? amLODipine (NORVASC) 5 MG tablet Take 1 tablet (5 mg total) by mouth daily for 90 doses. (Patient not taking: Reported on 05/17/2021) 90 tablet 1  ? ?No facility-administered medications prior to visit.  ? ? ?Allergies  ?Allergen Reactions   ? Perflutren Other (See Comments)  ?  Back pain  ? Risperidone Other (See Comments)  ?  Gynecomastia.   ? ? ?   ?Objective:  ?  ?BP 115/80   Pulse 77   Wt (!) 427 lb 12.8 oz (194 kg)   SpO2 94%   BMI 53.47 kg/m?  ?Wt Readings from Last 3 Encounters:  ?05/24/21 (!) 427 lb 12.8 oz (194 kg)  ?05/17/21 (!) 423 lb (191.9 kg)  ?03/31/21 (!) 425 lb (192.8 kg)  ? ? ?Physical Exam ?Constitutional:   ?   Appearance: He is well-developed. He is obese.  ?HENT:  ?   Head: Normocephalic and atraumatic.  ?   Right Ear: Hearing, tympanic membrane, ear canal and external ear normal.  ?   Left Ear: Hearing, tympanic membrane, ear canal and external ear normal.  ?   Nose: Nose normal. No mucosal edema or rhinorrhea.  ?   Right Turbinates: Not enlarged.  ?   Left Turbinates: Not enlarged.  ?   Mouth/Throat:  ?   Lips: Pink.  ?   Mouth: Mucous membranes are moist.  ?   Dentition: No gingival swelling, dental abscesses or gum lesions.  ?   Pharynx: Uvula midline.  ?   Tonsils: No tonsillar exudate. 1+ on the right. 1+ on the left.  ?Eyes:  ?   General: Lids are normal. No scleral icterus. ?   Extraocular Movements: Extraocular movements intact.  ?   Conjunctiva/sclera: Conjunctivae normal.  ?   Pupils: Pupils are equal, round, and reactive to light.  ?Neck:  ?   Thyroid: No thyromegaly.  ?   Trachea: No tracheal deviation.  ?Cardiovascular:  ?   Rate and Rhythm: Normal rate and regular rhythm.  ?   Heart sounds: Normal heart sounds. No murmur heard. ?  No friction rub. No gallop.  ?Pulmonary:  ?   Effort: Pulmonary effort is normal. No respiratory distress.  ?   Breath sounds: Normal breath sounds. No wheezing or rales.  ?Chest:  ?   Chest wall: No mass or tenderness.  ?Breasts: ?   Right: No inverted nipple, mass, nipple discharge, skin change or tenderness.  ?   Left: No inverted nipple, mass, nipple discharge, skin change or tenderness.  ?Abdominal:  ?   General: Bowel sounds are normal. There is no distension.  ?   Palpations:  Abdomen is soft. There is no mass.  ?   Tenderness: There is no abdominal tenderness. There is no guarding or rebound.  ?Musculoskeletal:     ?   General: No tenderness or deformity. Normal range of motion.  ?   Cervical back: Normal range of motion and neck supple.  ?Lymphadenopathy:  ?   Cervical: No cervical adenopathy.  ?Skin: ?   General: Skin is warm and dry.  ?   Capillary Refill: Capillary refill takes less than 2 seconds.  ?   Findings: No  erythema.  ?Neurological:  ?   Mental Status: He is alert and oriented to person, place, and time.  ?   Cranial Nerves: No cranial nerve deficit.  ?   Sensory: Sensation is intact.  ?   Motor: No abnormal muscle tone.  ?   Coordination: Coordination is intact. Coordination normal.  ?   Gait: Gait is intact.  ?   Deep Tendon Reflexes: Reflexes normal.  ?   Reflex Scores: ?     Patellar reflexes are 1+ on the right side and 1+ on the left side. ?Psychiatric:     ?   Attention and Perception: Attention normal.     ?   Mood and Affect: Mood normal.     ?   Speech: Speech normal.     ?   Behavior: Behavior normal.     ?   Thought Content: Thought content normal.     ?   Judgment: Judgment normal.  ? ? ? ? ?   ?Patient has been counseled extensively about nutrition and exercise as well as the importance of adherence with medications and regular follow-up. The patient was given clear instructions to go to ER or return to medical center if symptoms don't improve, worsen or new problems develop. The patient verbalized understanding.  ? ?Follow-up: Return if symptoms worsen or fail to improve.  ? ?Gildardo Pounds, FNP-BC ?[facility] ?[place], [place] ?[phone]   ?05/25/2021, 9:05 PM ?

## 2021-05-25 ENCOUNTER — Encounter: Payer: Self-pay | Admitting: Nurse Practitioner

## 2021-05-25 LAB — URINE CYTOLOGY ANCILLARY ONLY
Chlamydia: NEGATIVE
Comment: NEGATIVE
Comment: NEGATIVE
Comment: NORMAL
Neisseria Gonorrhea: NEGATIVE
Trichomonas: NEGATIVE

## 2021-05-25 LAB — HCV AB W REFLEX TO QUANT PCR: HCV Ab: NONREACTIVE

## 2021-05-25 LAB — VITAMIN B12: Vitamin B-12: 318 pg/mL (ref 232–1245)

## 2021-05-25 LAB — HSV(HERPES SIMPLEX VRS) I + II AB-IGG
HSV 1 Glycoprotein G Ab, IgG: <0.91 index (ref 0.00–0.90)
HSV 2 IgG, Type Spec: <0.91 index (ref 0.00–0.90)

## 2021-05-25 LAB — HCV INTERPRETATION

## 2021-05-25 LAB — VITAMIN D 25 HYDROXY (VIT D DEFICIENCY, FRACTURES): Vit D, 25-Hydroxy: 13.7 ng/mL — ABNORMAL LOW (ref 30.0–100.0)

## 2021-05-25 LAB — HIV ANTIBODY (ROUTINE TESTING W REFLEX): HIV Screen 4th Generation wRfx: NONREACTIVE

## 2021-05-25 MED ORDER — VITAMIN D (ERGOCALCIFEROL) 1.25 MG (50000 UNIT) PO CAPS: 50000.0000 [IU] | ORAL_CAPSULE | ORAL | 1 refills | Status: DC

## 2021-05-30 NOTE — Progress Notes (Signed)
? ?Synopsis: Referred for dyspnea, chest discomfort by Gildardo Pounds, NP ? ?Subjective:  ? ?PATIENT ID: Steven Diaz GENDER: male DOB: 1995-10-22, MRN: [id number] ? ?Chief Complaint  ?Patient presents with  ? Follow-up  ?  Review HST. He states he continued to feel fatigued.   ? ?26yM with history of asthma, BiPD, PTSD, HTN, severe obesity, covid-19 infection in 2021 referred for possible asthma ? ?Seen in ED 2/13 where EDP suspected bronchospasm responsive to steroids, nebs. Dr. Roxanne Mins referred him to [place]. He says he did feel a little better after those breathing treatments. And does feel the albuterol is helpful. Wasn't able to afford the prednisone so he didn't take.  ? ?He does think prednisone has at least partially been helpful for his DOE in past for asthma attacks.  ? ?Has had DOE over last 8 months. Pretty minimal cough. ? ?Has always had issues 'sleeping and breathing.' He has frequent PND. His mother has seen witnessed apneas. Snores loudly. Feels very sleepy during the day.  ? ?Previous inhalers: ?Albuterol ?Flovent ? ?Mother with COPD, emphysema ? ?Has been vaping but only with nicotine. Smoked cigarettes for 5-10 years half to 1 ppd. Worked as Quarry manager, Scientist, research (medical), now works at [employer]. They have cats, rabbit. He has lived in [place], [place], [place].  ? ?Interval HPI: ?Home sleep study with AHI 28.7, O2 nadir 69% (during respiratory event) and average 93%. No significant change supine vs right sided sleeping. ? ?Still feeling super sleepy during the day. Otherwise no change in DOE, minimal cough since last visit.  ? ?Otherwise pertinent review of systems is negative. ? ?Past Medical History:  ?Diagnosis Date  ? Asthma   ? Bipolar 1 disorder ([place])   ? Hypertension   ? PTSD (post-traumatic stress disorder)   ?  ? ?Family History  ?Problem Relation Age of Onset  ? COPD Mother   ? Heart disease Mother   ? Cancer Mother   ? Heart disease Father   ? Diabetes Maternal Grandmother   ? Diabetes Maternal Grandfather   ?  Diabetes Paternal Grandmother   ? Diabetes Paternal Grandfather   ?  ? ?No past surgical history on file. ? ?Social History  ? ?Socioeconomic History  ? Marital status: Legally Separated  ?  Spouse name: Not on file  ? Number of children: Not on file  ? Years of education: Not on file  ? Highest education level: Not on file  ?Occupational History  ? Not on file  ?Tobacco Use  ? Smoking status: Former  ?  Packs/day: 1.00  ?  Years: 15.00  ?  Pack years: 15.00  ?  Types: Cigarettes  ?  Quit date: 02/12/2020  ?  Years since quitting: 1.2  ? Smokeless tobacco: Never  ?Vaping Use  ? Vaping Use: Some days  ? Start date: 02/06/2020  ? Substances: Nicotine, Flavoring  ?Substance and Sexual Activity  ? Alcohol use: Not Currently  ? Drug use: Never  ? Sexual activity: Not on file  ?Other Topics Concern  ? Not on file  ?Social History Narrative  ? Not on file  ? ?Social Determinants of Health  ? ?Financial Resource Strain: Not on file  ?Food Insecurity: Not on file  ?Transportation Needs: Not on file  ?Physical Activity: Not on file  ?Stress: Not on file  ?Social Connections: Not on file  ?Intimate Partner Violence: Not on file  ?  ? ?Allergies  ?Allergen Reactions  ? Perflutren Other (See Comments)  ?  Back pain  ? Risperidone Other (See Comments)  ?  Gynecomastia.   ?  ? ?Outpatient Medications Prior to Visit  ?Medication Sig Dispense Refill  ? albuterol (VENTOLIN HFA) 108 (90 Base) MCG/ACT inhaler Inhale 2 puffs into the lungs every 6 (six) hours as needed for wheezing or shortness of breath. NEEDS PASS 18 g 1  ? hydrOXYzine (ATARAX) 10 MG tablet Take 1 tablet (10 mg total) by mouth 3 (three) times daily as needed. NEEDS PASS 90 tablet 1  ? Vitamin D, Ergocalciferol, (DRISDOL) 1.25 MG (50000 UNIT) CAPS capsule Take 1 capsule (50,000 Units total) by mouth every 7 (seven) days. 12 capsule 1  ? amLODipine (NORVASC) 5 MG tablet Take 1 tablet (5 mg total) by mouth daily for 90 doses. (Patient not taking: Reported on 05/31/2021) 90  tablet 1  ? ?No facility-administered medications prior to visit.  ? ? ? ? ? ?Objective:  ? ?Physical Exam: ? ?General appearance: 26 y.o., male, NAD, conversant  ?Eyes: anicteric sclerae; PERRL, tracking appropriately ?HENT: NCAT; MMM ?Neck: Trachea midline; no lymphadenopathy, no JVD ?Lungs: CTAB, no crackles, no wheeze, with normal respiratory effort ?CV: RRR, no murmur  ?Abdomen: Soft, non-tender; non-distended, BS present  ?Extremities: No peripheral edema, warm ?Skin: Normal turgor and texture; no rash ?Psych: Appropriate affect ?Neuro: Alert and oriented to person and place, no focal deficit  ? ? ? ?Vitals:  ? 05/31/21 1411  ?BP: (!) 146/84  ?Pulse: 96  ?SpO2: 96%  ?Weight: (!) 428 lb (194.1 kg)  ?Height: 6\' 3"  (1.905 m)  ? ? ? ?96% on RA ?BMI Readings from Last 3 Encounters:  ?05/31/21 53.50 kg/m?  ?05/24/21 53.47 kg/m?  ?05/17/21 52.87 kg/m?  ? ?Wt Readings from Last 3 Encounters:  ?05/31/21 (!) 428 lb (194.1 kg)  ?05/24/21 (!) 427 lb 12.8 oz (194 kg)  ?05/17/21 (!) 423 lb (191.9 kg)  ? ? ? ?CBC ?   ?Component Value Date/Time  ? WBC 10.6 (H) 03/20/2021 0055  ? RBC 5.33 03/20/2021 0055  ? HGB 13.8 03/20/2021 0055  ? HCT 44.0 03/20/2021 0055  ? PLT 358 03/20/2021 0055  ? MCV 82.6 03/20/2021 0055  ? MCH 25.9 (L) 03/20/2021 0055  ? MCHC 31.4 03/20/2021 0055  ? RDW 14.3 03/20/2021 0055  ? LYMPHSABS 4.5 (H) 03/20/2021 0055  ? MONOABS 0.8 03/20/2021 0055  ? EOSABS 0.2 03/20/2021 0055  ? BASOSABS 0.0 03/20/2021 0055  ? ? ? ?Chest Imaging: ?CTA Chest 03/18/21 reviewed by me unremarkable save for habitus ? ?Pulmonary Functions Testing Results: ? ?  Latest Ref Rng & Units 05/17/2021  ?  2:20 PM  ?PFT Results  ?FVC-Pre L 3.98    ?FVC-Predicted Pre % 62    ?FVC-Post L 4.10    ?FVC-Predicted Post % 64    ?Pre FEV1/FVC % % 83    ?Post FEV1/FCV % % 85    ?FEV1-Pre L 3.30    ?FEV1-Predicted Pre % 63    ?FEV1-Post L 3.47    ?DLCO uncorrected ml/min/mmHg 33.92    ?DLCO UNC% % 88    ?DLCO corrected ml/min/mmHg 33.92    ?DLCO COR  %Predicted % 88    ?DLVA Predicted % 133    ?TLC L 5.92    ?TLC % Predicted % 74    ?RV % Predicted % 98    ? ?Reviewed by me with moderate restriction ? ?PSG 05/12/21 with AHI 28.7, O2 nadir 69% (during respiratory event) and average 93%. No significant change supine vs  right sided sleeping ? ?Echocardiogram:  ? ?TTE 03/14/21: ? ? 1. Left ventricular ejection fraction, by estimation, is 55 to 60%. The  ?left ventricle has normal function. The left ventricle has no regional  ?wall motion abnormalities. Left ventricular diastolic parameters were  ?normal.  ? 2. Right ventricular systolic function is normal. The right ventricular  ?size is normal.  ? 3. The mitral valve is normal in structure. No evidence of mitral valve  ?regurgitation. No evidence of mitral stenosis.  ? 4. The aortic valve is normal in structure. Aortic valve regurgitation is  ?not visualized. No aortic stenosis is present.  ? 5. The inferior vena cava is normal in size with greater than 50%  ?respiratory variability, suggesting right atrial pressure of 3 mmHg.  ? ? ?   ?Assessment & Plan:  ? ?# DOE ?# History of asthma ?May be multifactorial with chart history of asthma - does at least have partial steroid/SABA response but not wheezy on exam today and pre/post BD spiro aren't suggestive of it, deconditioning, untreated OSA. If he has asthma then gerd may complicate control.  ? ?# Moderate OSA ?Recommended TLC measures to support loss of 10% body weight. Desires autoPAP but uninsured currently. He'll look around for refurbished machine. ? ?Plan: ?- instructions given for settings needed for autopap and maintenance ?- albuterol prn ?- flonase if worsening sinonasal congestion/postnasal drainage ?- TLC measures for gerd have been discussed ?- side sleeping to minimize gerd ? ?RTC 3 months ? ? ?I spent 32 minutes dedicated to the care of this patient on the date of this encounter to include pre-visit review of records, face-to-face time with the patient  discussing conditions above, clinical documentation with the electronic health record, and communicating necessary findings to members of the patients care team.   ? ? ?Maryjane Hurter, MD ?[facility]

## 2021-05-31 ENCOUNTER — Encounter: Payer: Self-pay | Admitting: Student

## 2021-05-31 ENCOUNTER — Ambulatory Visit (INDEPENDENT_AMBULATORY_CARE_PROVIDER_SITE_OTHER): Payer: Medicaid Other | Admitting: Student

## 2021-05-31 ENCOUNTER — Ambulatory Visit: Payer: Self-pay | Admitting: Student

## 2021-05-31 VITALS — BP 146/84 | HR 96 | Ht 75.0 in | Wt >= 6400 oz

## 2021-05-31 DIAGNOSIS — R0609 Other forms of dyspnea: Secondary | ICD-10-CM

## 2021-05-31 DIAGNOSIS — G4733 Obstructive sleep apnea (adult) (pediatric): Secondary | ICD-10-CM

## 2021-05-31 NOTE — Patient Instructions (Addendum)
- See you in 3 months! ?- Recommend autopap with pressure range 5-15 cm H2O with device like Resmed or DreamStation air/auto with heated humidity. Enroll in [employer] if you are able.  ?- CPAP supplies needed: mask of choice (can try anything from nasal pillows to oronasal mask), headgear, cushions, filters, climate control tubing and water chamber. ?- see instructions below for maintaining whatever device you are able to purchase: ? ? ?CPAP CLEANING INSTRUCTIONS ?Along with proper CPAP cleaning it is recommended that you replace your mask, tubing and filters once very 3 months and more frequently if you are sick. ? ?DAILY CLEANING ?Do NOT use moisturizing soaps, bleach, scented oils, chlorine, or alcohol-based solutions to clean your supplies. These solutions may cause irritation to your skin and lungs and may reduce the life of your products. Dawn [employer] or Comparable works best for daily cleaning. ? ?**If you?ve been sick, it?s smart to wash your mask, tubing, humidifier and filter daily until your cold, flu or virus symptoms are gone. That can help reduce the amount of time you spend under the weather. ? ?Before using your mask -wash your face daily with soap and water to remove excess facial oils. ? ?Wipe down your mask (including areas that come in contact with your skin) using a damp towel with soap and warm water. This will remove any oils, dead skin cells, and sweat on the mask that can affect the quality of the seal. Gently rinse with a clean towel and let the mask air-dry out of direct sunlight. ? ?You can also use unscented baby wipes or pre-moistened towels designed specifically for cleaning CPAP masks, which are available on-line. DO NOT USE CLOROX OR DISINFECTING WIPES. ? ?If your unit has a humidifier, empty any leftover water instead of letting in sit in the unit all day. Refill the humidifier with clean, distilled water right before bedtime for optimal use ? ?WEEKLY (OR MORE  FREQUENT) CLEANING ?Your mask and tubing need a full bath at least once a week to keep it free of dust, bacteria, and germs. (During COVID-19 or any other flu/virus we recommend more frequent cleaning) ? ?Clean the CPAP tubing, nasal mask, and headgear in a bathroom sink filled with warm water and a few drops of ammonia-free, mild dish detergent. Avoid using stronger cleaning products, as they may damage the mask or leave harmful residue.  ? ?Swirl all parts around for about five minutes, rinse well and let air dry during the day. Hang the tubing over the shower rod, on a towel rack or in the laundry room to ensure all the water drips out. ? ?The mask and headgear can be air-dried on a towel or hung on a hook or hanger. ? ?You should also wipe down your CPAP machine with a damp cloth. Ensure the unit is unplugged. The towel shouldn?t be too damp or wet, as water could get into the machine. ? ?Clean the filter by removing it and rinsing it in warm tap water. Run it under the water and squeeze to make sure there is no dust. Then blot down the filter with a towel. ? ?DO NOT wash your machine?s white filter, if one is present--those are disposable and you should replace white filter every two weeks. If you are recovering from being sick, we recommend changing the filter sooner. ? ?If your CPAP has a humidifier, that also needs to be cleaned weekly. Empty any remaining water and then wash the water chamber  in the sink with warm soapy water. Rinse well and drain out as much of the water as possible. Let the chamber air-dry before placing it back into the CPAP unit. ? ?Every other week you should disinfect the humidifier. Do that by soaking it in a solution of one-part vinegar to five parts water for 30 minutes, thoroughly rinsing and then placing in your dishwasher?s top rack for washing. And keep it clean by using only distilled water to prevent mineral deposits that can build up and cause damage to your  machine. ? ?IMPORTANT TIPS ?Make caring for your CPAP equipment part of your morning routine. ?Keep machine and accessories out of direct sunlight to avoid damaging them. ?Never use bleach to clean accessories. ?Place machine on a level surface and away from curtains that may interfere with the air intake. ? ?

## 2021-06-01 ENCOUNTER — Telehealth: Payer: Self-pay | Admitting: Student

## 2021-06-02 NOTE — Telephone Encounter (Signed)
Called patient but he did not answer. His VM is not setup. Will call back later.  ? ?I reached out the sleep apnea association and they do have a program to help uninsured patients to get a cpap machine. The program director will email an application. Will offer this to the patient when he calls back.  ?

## 2021-06-05 ENCOUNTER — Encounter: Payer: Self-pay | Admitting: Emergency Medicine

## 2021-06-05 ENCOUNTER — Emergency Department (HOSPITAL_COMMUNITY)
Admission: EM | Admit: 2021-06-05 | Discharge: 2021-06-05 | Disposition: A | Discharge status: Home / Self Care | Payer: Self-pay | Attending: Emergency Medicine | Admitting: Emergency Medicine

## 2021-06-05 ENCOUNTER — Ambulatory Visit
Admission: EM | Admit: 2021-06-05 | Discharge: 2021-06-05 | Disposition: A | Discharge status: Home / Self Care | Payer: Medicaid Other | Attending: Internal Medicine | Admitting: Internal Medicine

## 2021-06-05 ENCOUNTER — Emergency Department (HOSPITAL_COMMUNITY): Payer: Self-pay

## 2021-06-05 ENCOUNTER — Encounter (HOSPITAL_COMMUNITY): Payer: Self-pay | Admitting: *Deleted

## 2021-06-05 ENCOUNTER — Other Ambulatory Visit: Payer: Self-pay

## 2021-06-05 DIAGNOSIS — R350 Frequency of micturition: Secondary | ICD-10-CM

## 2021-06-05 DIAGNOSIS — M545 Low back pain, unspecified: Secondary | ICD-10-CM

## 2021-06-05 DIAGNOSIS — R159 Full incontinence of feces: Secondary | ICD-10-CM

## 2021-06-05 DIAGNOSIS — N3944 Nocturnal enuresis: Secondary | ICD-10-CM | POA: Insufficient documentation

## 2021-06-05 DIAGNOSIS — Z79899 Other long term (current) drug therapy: Secondary | ICD-10-CM | POA: Insufficient documentation

## 2021-06-05 DIAGNOSIS — N3090 Cystitis, unspecified without hematuria: Secondary | ICD-10-CM | POA: Insufficient documentation

## 2021-06-05 DIAGNOSIS — I10 Essential (primary) hypertension: Secondary | ICD-10-CM | POA: Insufficient documentation

## 2021-06-05 DIAGNOSIS — N39498 Other specified urinary incontinence: Secondary | ICD-10-CM

## 2021-06-05 DIAGNOSIS — J45909 Unspecified asthma, uncomplicated: Secondary | ICD-10-CM | POA: Insufficient documentation

## 2021-06-05 LAB — URINALYSIS, ROUTINE W REFLEX MICROSCOPIC
Bilirubin Urine: NEGATIVE
Glucose, UA: NEGATIVE mg/dL
Hgb urine dipstick: NEGATIVE
Ketones, ur: NEGATIVE mg/dL
Leukocytes,Ua: NEGATIVE
Nitrite: NEGATIVE
Protein, ur: NEGATIVE mg/dL
Specific Gravity, Urine: 1.017 (ref 1.005–1.030)
pH: 5 (ref 5.0–8.0)

## 2021-06-05 LAB — POCT URINALYSIS DIP (MANUAL ENTRY)
Bilirubin, UA: NEGATIVE
Blood, UA: NEGATIVE
Glucose, UA: NEGATIVE mg/dL
Ketones, POC UA: NEGATIVE mg/dL
Leukocytes, UA: NEGATIVE
Nitrite, UA: NEGATIVE
Protein Ur, POC: NEGATIVE mg/dL
Spec Grav, UA: 1.02 (ref 1.010–1.025)
Urobilinogen, UA: 0.2 E.U./dL
pH, UA: 7.5 (ref 5.0–8.0)

## 2021-06-05 MED ORDER — NITROFURANTOIN MONOHYD MACRO 100 MG PO CAPS: 100.0000 mg | ORAL_CAPSULE | Freq: Two times a day (BID) | ORAL | 0 refills | Status: DC

## 2021-06-05 NOTE — Discharge Instructions (Addendum)
You are seen in the ER for urinary discomfort and accidents at nighttime. ? ?We had ordered a CT scan which is negative for any kidney stones.  Your spine imaging appears to show some degenerative spine disease, but no other concerns.  The urine analysis does not have any signs of infection, but given that you are having some burning with urination and urinary frequency, we will put you on some antibiotics. ? ?We would like you to follow-up with the urologist for your incontinence to get some urodynamic studies completed. ? ?We would like to consider following up with neurosurgery if you are having worsening back pain. ? ?We would like you to return to the ER immediately if you start havin pins and needle sensation by your genitalia, weakness over the legs, numbness over the legs, urinary retention (inability to void), fecal incontinence. ?

## 2021-06-05 NOTE — ED Triage Notes (Signed)
Patient c/o dysuria, urinary frequency, kidneys burning x 3-4 days.  Urinary incontinence, no hematuria, urinary urgency.  ?

## 2021-06-05 NOTE — ED Notes (Signed)
I provided reinforced discharge education based off of discharge instructions. Pt acknowledged and understood my education. Pt had no further questions/concerns for provider/myself.  °

## 2021-06-05 NOTE — Discharge Instructions (Signed)
Go to the emergency department as soon as you leave urgent care for further evaluation and management. 

## 2021-06-05 NOTE — ED Notes (Signed)
Contacted lab to have urine culture processed. ?

## 2021-06-05 NOTE — ED Provider Notes (Signed)
?[facility]-EMERGENCY DEPT ?Provider Note ? ? ?CSN: [id number] ?Arrival date & time: 06/05/21  1556 ? ?  ? ?History ? ?Chief Complaint  ?Patient presents with  ? Urinary Frequency  ? ? ?Steven Diaz is a 26 y.o. male. ? ?HPI ? ?  ?26 year old male comes in with chief complaint of urinary frequency.  Patient was sent from the urgent care. ? ?Patient has history of hypertension, asthma.  He indicates that 3 days ago he woke up from his sleep, and noted that he had wet himself.  That trend has continued every night since then.  During the daytime, he has no incontinence.  What he is experiencing daytime is some burning with urination and urinary frequency with reduced urine output. ? ?Patient denies any penile discharge, drainage.  He indicates that he has not had intercourse in 3 years -his partner just tested for STDs and all results were negative.  He was tested for STDs and was negative.  He did not get a swab at that time, but he is not concerned about STDs as he has not had intercourse, oral or penetrating in 3 years.. ? ?With the burning with urination, he has noticed some back pain that is bilateral in the flank region.  He has chronic midline lumbar spine pain, it is not worse than usual.  He is ambulating well, he has no new numbness or tingling in his legs and he denies any fecal incontinence or urinary retention. ? ?Home Medications ?Prior to Admission medications   ?Medication Sig Start Date End Date Taking? Authorizing Provider  ?nitrofurantoin, macrocrystal-monohydrate, (MACROBID) 100 MG capsule Take 1 capsule (100 mg total) by mouth 2 (two) times daily. 06/05/21  Yes Derwood Kaplan, MD  ?albuterol (VENTOLIN HFA) 108 (90 Base) MCG/ACT inhaler Inhale 2 puffs into the lungs every 6 (six) hours as needed for wheezing or shortness of breath. NEEDS PASS 05/24/21   Claiborne Rigg, NP  ?amLODipine (NORVASC) 5 MG tablet Take 1 tablet (5 mg total) by mouth daily for 90 doses. ?Patient not  taking: Reported on 05/31/2021 03/29/21 06/27/21  Claiborne Rigg, NP  ?hydrOXYzine (ATARAX) 10 MG tablet Take 1 tablet (10 mg total) by mouth 3 (three) times daily as needed. NEEDS PASS 05/24/21   Claiborne Rigg, NP  ?Vitamin D, Ergocalciferol, (DRISDOL) 1.25 MG (50000 UNIT) CAPS capsule Take 1 capsule (50,000 Units total) by mouth every 7 (seven) days. 05/25/21   Claiborne Rigg, NP  ?   ? ?Allergies    ?Perflutren and Risperidone   ? ?Review of Systems   ?Review of Systems  ?All other systems reviewed and are negative. ? ?Physical Exam ?Updated Vital Signs ?BP 132/73 (BP Location: Left Arm)   Pulse 98   Temp 98.3 ?F (36.8 ?C) (Oral)   Resp 18   Ht 6\' 3"  (1.905 m)   Wt (!) 181.4 kg   SpO2 95%   BMI 50.00 kg/m?  ?Physical Exam ?Vitals and nursing note reviewed.  ?Constitutional:   ?   Appearance: He is well-developed.  ?HENT:  ?   Head: Atraumatic.  ?Cardiovascular:  ?   Rate and Rhythm: Normal rate.  ?Pulmonary:  ?   Effort: Pulmonary effort is normal.  ?Musculoskeletal:  ?   Cervical back: Neck supple.  ?Skin: ?   General: Skin is warm.  ?Neurological:  ?   Mental Status: He is alert and oriented to person, place, and time.  ?   Sensory: No sensory deficit.  ?  Motor: No weakness.  ?   Comments: Gross sensory exam for bilateral lower extremity is normal.  Strength is 4+ out of 5 lower extremity. ?Patient has 2+ patellar reflex ?Patient has deferred digital rectal exam  ? ? ?ED Results / Procedures / Treatments   ?Labs ?(all labs ordered are listed, but only abnormal results are displayed) ?Labs Reviewed  ?URINE CULTURE  ?URINALYSIS, ROUTINE W REFLEX MICROSCOPIC  ? ? ?EKG ?None ? ?Radiology ?CT L-SPINE NO CHARGE ? ?Result Date: 06/05/2021 ?CLINICAL DATA:  Back and flank pain. EXAM: CT LUMBAR SPINE WITHOUT CONTRAST TECHNIQUE: Multidetector CT imaging of the lumbar spine was performed without intravenous contrast administration. Multiplanar CT image reconstructions were also generated. RADIATION DOSE REDUCTION:  This exam was performed according to the departmental dose-optimization program which includes automated exposure control, adjustment of the mA and/or kV according to patient size and/or use of iterative reconstruction technique. COMPARISON:  None. FINDINGS: Segmentation: 5 lumbar type vertebrae. Alignment: Straightening of the normal lumbar lordosis. No significant listhesis. Vertebrae: No acute fracture or focal pathologic process. Paraspinal and other soft tissues: Please see separate CT abdomen pelvis report from same day. Disc levels: T11-T12: Tiny shallow left paracentral disc protrusion. Mild bilateral facet arthropathy. No stenosis. T12-L1:  Negative. L1-L2: Negative disc. Mild bilateral facet arthropathy. No stenosis. L2-L3: Negative disc. Mild bilateral facet arthropathy. No stenosis. L3-L4:  Negative. L4-L5: Negative disc. Mild bilateral facet arthropathy. No stenosis. L5-S1: Negative disc. Mild bilateral facet arthropathy. Mild bilateral neuroforaminal stenosis. No spinal canal stenosis. IMPRESSION: 1. No acute osseous abnormality. 2. Mild lumbar facet arthropathy.  No high-grade stenosis. Electronically Signed   By: Obie DredgeWilliam T Derry M.D.   On: 06/05/2021 18:51  ? ?CT Renal Stone Study ? ?Result Date: 06/05/2021 ?CLINICAL DATA:  Back and flank pain. EXAM: CT ABDOMEN AND PELVIS WITHOUT CONTRAST TECHNIQUE: Multidetector CT imaging of the abdomen and pelvis was performed following the standard protocol without IV contrast. RADIATION DOSE REDUCTION: This exam was performed according to the departmental dose-optimization program which includes automated exposure control, adjustment of the mA and/or kV according to patient size and/or use of iterative reconstruction technique. COMPARISON:  CT pelvis dated October 06, 2020. FINDINGS: Lower chest: No acute abnormality. Hepatobiliary: Diffusely decreased liver density without focal abnormality. Small calcified granuloma in the peripheral right liver. Gallbladder is  unremarkable. No biliary dilatation. Pancreas: Unremarkable. No pancreatic ductal dilatation or surrounding inflammatory changes. Spleen: Normal in size without focal abnormality. Adrenals/Urinary Tract: Adrenal glands are unremarkable. Kidneys are normal, without renal calculi, focal lesion, or hydronephrosis. Bladder is unremarkable. Stomach/Bowel: Stomach is within normal limits. Appendix appears normal. No evidence of bowel wall thickening, distention, or inflammatory changes. Vascular/Lymphatic: No significant vascular findings are present. No enlarged abdominal or pelvic lymph nodes. Reproductive: Prostate is unremarkable. Other: Tiny fat containing umbilical hernia. No free fluid or pneumoperitoneum. Musculoskeletal: No acute or significant osseous findings. IMPRESSION: 1. No acute intra-abdominal process. 2. Hepatic steatosis. Electronically Signed   By: Obie DredgeWilliam T Derry M.D.   On: 06/05/2021 18:32   ? ?Procedures ?Procedures  ? ? ?Medications Ordered in ED ?Medications - No data to display ? ?ED Course/ Medical Decision Making/ A&P ?  ?                        ?Medical Decision Making ?Amount and/or Complexity of Data Reviewed ?Labs: ordered. ?Radiology: ordered. ? ?Risk ?Prescription drug management. ? ? ?26 year old male comes in with chief complaint of urinary incontinence.  He also  has bilateral flank pain and history of chronic lower back pain.  Patient is also complaining of some dysuria and urinary frequency.  During the daytime he has not had any incontinence. ? ?Differential diagnosis includes acute cystitis, renal stones, pyelonephritis, bladder spasms, cauda equina syndrome. ? ?He was sent to the ER from urgent care because of the incontinence and back pain.  Patient's neurologic exam is reassuring.  He denies any worsening back pain and has no neurodeficits that are concerning.  Urinary incontinence is only present during nighttime, which makes it unusual for it to be due to cauda equina syndrome  or cord compression. ? ?We will get CT renal stone and with that reformat the images to look at his lumbar spine.  ? ?Patient is confident that he does not have STDs and does not want [place] to swab him.  He is

## 2021-06-05 NOTE — ED Notes (Addendum)
Patient is being discharged from the Urgent Care and sent to the Emergency Department via pov . Per mound np, patient is in need of higher level of care due to symptoms. Patient is aware and verbalizes understanding of plan of care.  ?Vitals:  ? 06/05/21 1451  ?BP: (!) 152/88  ?Pulse: 82  ?Resp: 20  ?Temp: 98 ?F (36.7 ?C)  ?SpO2: 96%  ?  ?

## 2021-06-05 NOTE — ED Triage Notes (Signed)
Sent from UC with urinary frequency with incontinence , pt had dip stick Neg urine. 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 Hurting in back and kidney area ?

## 2021-06-05 NOTE — ED Provider Notes (Signed)
?[name] ? ? ? ?CSN: [id number] ?Arrival date & time: 06/05/21  1304 ? ? ?  ? ?History   ?Chief Complaint ?Chief Complaint  ?Patient presents with  ? Dysuria  ? ? ?HPI ?Steven Diaz is a 26 y.o. male.  ? ?Patient presents with urinary incontinence, urinary burning, "kidneys burning", urinary frequency that started approximately 3 days ago.  When asked to elaborate on complaint of kidneys burning, patient reports that he is having bilateral back pain.  Denies penile discharge, fever, abdominal pain.  Patient does report testicular pain but states that this is chronic as he has cysts in his testicles.  Patient reports that he has been waking up at night due to urinary incontinence.  When asked about bowel movements, patient reports that he has noticed that he has stool oozing out of his rectum that started around the same time as urinary symptoms.  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  Back pain is increased over the past few days as well.  Denies any recent injury to the back.  Denies any exposure to STD.  Patient reports that he is not currently sexually active.  He was tested with routine STD testing a few days prior that was all negative per patient. ? ? ?Dysuria ? ?Past Medical History:  ?Diagnosis Date  ? Asthma   ? Bipolar 1 disorder (HCC)   ? Hypertension   ? PTSD (post-traumatic stress disorder)   ? ? ?Patient Active Problem List  ? Diagnosis Date Noted  ? Major depressive disorder, recurrent episode, mild (HCC) 06/13/2020  ? Posttraumatic stress disorder 03/08/2020  ? ? ?History reviewed. No pertinent surgical history. ? ? ? ? ?Home Medications   ? ?Prior to Admission medications   ?Medication Sig Start Date End Date Taking? Authorizing Provider  ?albuterol (VENTOLIN HFA) 108 (90 Base) MCG/ACT inhaler Inhale 2 puffs into the lungs every 6 (six) hours as needed for wheezing or shortness of breath.  NEEDS PASS 05/24/21  Yes Claiborne RiggFleming, Zelda W, NP  ?hydrOXYzine (ATARAX) 10 MG tablet Take 1 tablet (10 mg total) by mouth 3 (three) times daily as needed. NEEDS PASS 05/24/21  Yes Claiborne RiggFleming, Zelda W, NP  ?Vitamin D, Ergocalciferol, (DRISDOL) 1.25 MG (50000 UNIT) CAPS capsule Take 1 capsule (50,000 Units total) by mouth every 7 (seven) days. 05/25/21  Yes Claiborne RiggFleming, Zelda W, NP  ?amLODipine (NORVASC) 5 MG tablet Take 1 tablet (5 mg total) by mouth daily for 90 doses. ?Patient not taking: Reported on 05/31/2021 03/29/21 06/27/21  Claiborne RiggFleming, Zelda W, NP  ? ? ?Family History ?Family History  ?Problem Relation Age of Onset  ? COPD Mother   ? Heart disease Mother   ? Cancer Mother   ? Heart disease Father   ? Diabetes Maternal Grandmother   ? Diabetes Maternal Grandfather   ? Diabetes Paternal Grandmother   ? Diabetes Paternal Grandfather   ? ? ?Social History ?Social History  ? ?Tobacco Use  ? Smoking status: Former  ?  Packs/day: 1.00  ?  Years: 15.00  ?  Pack years: 15.00  ?  Types: Cigarettes  ?  Quit date: 02/12/2020  ?  Years since quitting: 1.3  ? Smokeless tobacco: Never  ?Vaping Use  ? Vaping Use: Some days  ? Start date: 02/06/2020  ? Substances: Nicotine, Flavoring  ?Substance Use Topics  ? Alcohol use: Not Currently  ? Drug use: Never  ? ? ? ?  Allergies   ?Perflutren and Risperidone ? ? ?Review of Systems ?Review of Systems ?Per HPI ? ?Physical Exam ?Triage Vital Signs ?ED Triage Vitals  ?Enc Vitals Group  ?   BP 06/05/21 1451 (!) 152/88  ?   Pulse Rate 06/05/21 1451 82  ?   Resp 06/05/21 1451 20  ?   Temp 06/05/21 1451 98 ?F (36.7 ?C)  ?   Temp Source 06/05/21 1451 Oral  ?   SpO2 06/05/21 1451 96 %  ?   Weight 06/05/21 1458 (!) 400 lb (181.4 kg)  ?   Height 06/05/21 1458 6\' 3"  (1.905 m)  ?   Head Circumference --   ?   Peak Flow --   ?   Pain Score 06/05/21 1458 10  ?   Pain Loc --   ?   Pain Edu? --   ?   Excl. in GC? --   ? ?No data found. ? ?Updated Vital Signs ?BP (!) 152/88 (BP Location: Left Arm)   Pulse 82   Temp 98 ?F  (36.7 ?C) (Oral)   Resp 20   Ht 6\' 3"  (1.905 m)   Wt (!) 400 lb (181.4 kg)   SpO2 96%   BMI 50.00 kg/m?  ? ?Visual Acuity ?Right Eye Distance:   ?Left Eye Distance:   ?Bilateral Distance:   ? ?Right Eye Near:   ?Left Eye Near:    ?Bilateral Near:    ? ?Physical Exam ?Constitutional:   ?   General: He is not in acute distress. ?   Appearance: Normal appearance. He is not toxic-appearing or diaphoretic.  ?HENT:  ?   Head: Normocephalic and atraumatic.  ?Eyes:  ?   Extraocular Movements: Extraocular movements intact.  ?   Conjunctiva/sclera: Conjunctivae normal.  ?Cardiovascular:  ?   Rate and Rhythm: Normal rate and regular rhythm.  ?   Pulses: Normal pulses.  ?   Heart sounds: Normal heart sounds.  ?Pulmonary:  ?   Effort: Pulmonary effort is normal. No respiratory distress.  ?   Breath sounds: Normal breath sounds.  ?Abdominal:  ?   General: Bowel sounds are normal. There is no distension.  ?   Palpations: Abdomen is soft.  ?   Tenderness: There is no abdominal tenderness.  ?Musculoskeletal:  ?   Cervical back: Normal.  ?   Thoracic back: Normal.  ?   Lumbar back: Tenderness present. No swelling or edema. Negative right straight leg raise test.  ?     Back: ? ?Neurological:  ?   General: No focal deficit present.  ?   Mental Status: He is alert and oriented to person, place, and time. Mental status is at baseline.  ?Psychiatric:     ?   Mood and Affect: Mood normal.     ?   Behavior: Behavior normal.     ?   Thought Content: Thought content normal.     ?   Judgment: Judgment normal.  ? ? ? ?UC Treatments / Results  ?Labs ?(all labs ordered are listed, but only abnormal results are displayed) ?Labs Reviewed  ?POCT URINALYSIS DIP (MANUAL ENTRY)  ? ? ?EKG ? ? ?Radiology ?No results found. ? ?Procedures ?Procedures (including critical care time) ? ?Medications Ordered in UC ?Medications - No data to display ? ?Initial Impression / Assessment and Plan / UC Course  ?I have reviewed the triage vital signs and the  nursing notes. ? ?Pertinent labs & imaging results that were available during  my care of the patient were reviewed by me and considered in my medical decision making (see chart for details). ? ?  ? ?Urinalysis did not show any obvious abnormalities or indicate urinary tract infection.  There is concern due to fecal and urinary incontinence associated with back pain.  I do think the patient needs a more advanced imaging to rule out worrisome etiologies with compression in these areas causing these symptoms.  Patient was advised to go to the emergency department as soon as possible for further evaluation and management.  Patient was agreeable with plan.  Vital signs stable at discharge.  Agree with patient self transport to the hospital. ?Final Clinical Impressions(s) / UC Diagnoses  ? ?Final diagnoses:  ?Other urinary incontinence  ?Incontinence of feces, unspecified fecal incontinence type  ?Urinary frequency  ?Acute bilateral low back pain without sciatica  ? ? ? ?Discharge Instructions   ? ?  ?Go to the emergency department as soon as you leave urgent care for further evaluation and management. ? ? ? ?ED Prescriptions   ?None ?  ? ?PDMP not reviewed this encounter. ?  ?Gustavus Bryant, [place] ?06/05/21 1516 ? ?

## 2021-06-06 LAB — URINE CULTURE: Culture: NO GROWTH

## 2021-06-14 ENCOUNTER — Ambulatory Visit: Payer: Medicaid Other | Admitting: Student

## 2021-06-28 NOTE — Progress Notes (Unsigned)
Patient ID: Steven Diaz, male   DOB: [birth year], 26 y.o.   MRN: [id number]  ED visit 06/05/2021 and treated for UTI. 26 year old male comes in with chief complaint of urinary incontinence.  He also has bilateral flank pain and history of chronic lower back pain.  Patient is also complaining of some dysuria and urinary frequency.  During the daytime he has not had any incontinence.   Differential diagnosis includes acute cystitis, renal stones, pyelonephritis, bladder spasms, cauda equina syndrome.   He was sent to the ER from urgent care because of the incontinence and back pain.  Patient's neurologic exam is reassuring.  He denies any worsening back pain and has no neurodeficits that are concerning.  Urinary incontinence is only present during nighttime, which makes it unusual for it to be due to cauda equina syndrome or cord compression.   We will get CT renal stone and with that reformat the images to look at his lumbar spine.    Patient is confident that he does not have STDs and does not want [place] to swab him.  He is also declined digital rectal exam.    If the CT scan is negative for any acute findings, then we will put him on Macrobid but have him follow-up with urology to see if he needs any urodynamic studies.  We will also give him referral information for neurosurgery and discuss strict ER return precautions for cauda equina syndrome.     7:25 PM Patient's work-up is reassuring.  CT scan does not show any kidney stone.  There is chronic degenerative changes to the spine.  Discussed with the patient.  Overall low suspicion for cauda equina still, patient therefore is comfortable not proceeding with digital rectal exam to check the sphincter tone.   Discussed with him the importance of following up with urology.  As needed neurosurgery information provided.  He will return to the ER if he starts having urinary incontinence during the daytime, saddle anesthesia, lower extremity weakness or  urinary retention/fecal incontinence.

## 2021-06-29 ENCOUNTER — Ambulatory Visit: Payer: Medicaid Other | Admitting: Physician Assistant

## 2021-06-30 ENCOUNTER — Emergency Department
Admission: EM | Admit: 2021-06-30 | Discharge: 2021-06-30 | Disposition: A | Discharge status: Home / Self Care | Payer: Medicaid Other | Attending: Emergency Medicine | Admitting: Emergency Medicine

## 2021-06-30 ENCOUNTER — Other Ambulatory Visit: Payer: Self-pay

## 2021-06-30 DIAGNOSIS — M47816 Spondylosis without myelopathy or radiculopathy, lumbar region: Secondary | ICD-10-CM | POA: Insufficient documentation

## 2021-06-30 DIAGNOSIS — J45909 Unspecified asthma, uncomplicated: Secondary | ICD-10-CM | POA: Insufficient documentation

## 2021-06-30 DIAGNOSIS — I10 Essential (primary) hypertension: Secondary | ICD-10-CM | POA: Insufficient documentation

## 2021-06-30 DIAGNOSIS — G8929 Other chronic pain: Secondary | ICD-10-CM | POA: Insufficient documentation

## 2021-06-30 DIAGNOSIS — M545 Low back pain, unspecified: Secondary | ICD-10-CM

## 2021-06-30 MED ORDER — DICLOFENAC SODIUM 75 MG PO TBEC: 75.0000 mg | DELAYED_RELEASE_TABLET | Freq: Two times a day (BID) | ORAL | 0 refills | Status: AC

## 2021-06-30 MED ORDER — KETOROLAC TROMETHAMINE 60 MG/2ML IM SOLN
60.0000 mg | Freq: Once | INTRAMUSCULAR | Status: AC
Start: 2021-06-30 — End: 2021-06-30
  Administered 2021-06-30: 60 mg via INTRAMUSCULAR
  Filled 2021-06-30: qty 2

## 2021-06-30 MED ORDER — CYCLOBENZAPRINE HCL 5 MG PO TABS: 5.0000 mg | ORAL_TABLET | Freq: Every day | ORAL | 0 refills | Status: DC

## 2021-06-30 NOTE — ED Triage Notes (Signed)
Pt presents to ED with c/o of mid to low back pain. Pt states this a chronic issue but reports his muscle relaxer's aren't working now. Pt denies loss of bowel or bladder control. NAD noted.

## 2021-06-30 NOTE — Discharge Instructions (Addendum)
You have some mild underlying facet arthritis.  You should take the newly prescribed anti-inflammatory daily, and the muscle relaxant as needed.  Follow-up with primary provider for ongoing symptoms.  Return to the ED if needed.

## 2021-06-30 NOTE — ED Notes (Signed)
67 yom with a c/c of lower back pain. The pt has a hx of same and advised the meds are no longer working.

## 2021-06-30 NOTE — ED Provider Notes (Signed)
[facility] Emergency Department Provider Note     Event Date/Time   First MD Initiated Contact with Patient 06/30/21 1335     (approximate)   History   Back Pain   HPI  Jacorian Golaszewski is a 26 y.o. male with history of PTSD and major depressive as well as asthma and hypertension, presents to the ED with mid to low back pain.  Patient reports a history of chronic back pain, denies any recent injury, trauma, fall.  He notes that his prescribed muscle actions are working now.  Denies any cough, chest pain, shortness of breath, weakness.  Patient denies any bladder or bowel incontinence, foot drop, or saddle anesthesia.    Physical Exam   Triage Vital Signs: ED Triage Vitals  Enc Vitals Group     BP 06/30/21 1235 (!) 144/83     Pulse Rate 06/30/21 1235 (!) 114     Resp 06/30/21 1235 17     Temp 06/30/21 1235 98.4 F (36.9 C)     Temp Source 06/30/21 1235 Oral     SpO2 06/30/21 1235 96 %     Weight --      Height --      Head Circumference --      Peak Flow --      Pain Score 06/30/21 1236 10     Pain Loc --      Pain Edu? --      Excl. in GC? --     Most recent vital signs: Vitals:   06/30/21 1235  BP: (!) 144/83  Pulse: (!) 114  Resp: 17  Temp: 98.4 F (36.9 C)  SpO2: 96%    General Awake, no distress.  CV:  Good peripheral perfusion.  RESP:  Normal effort.  ABD:  No distention.  MSK:  Normal spinal alignment without significant midline tenderness, spasm, deformity, or step-off.  Patient with full active range of motion of the lower extremities bilaterally.   ED Results / Procedures / Treatments   Labs (all labs ordered are listed, but only abnormal results are displayed) Labs Reviewed - No data to display   EKG    RADIOLOGY  I personally viewed and evaluated these images as part of my medical decision making, as well as reviewing the written report by the radiologist.  ED Provider Interpretation: no acute  findings  CT Lumbar Spine (No Charge) 06/05/21   IMPRESSION: 1. No acute osseous abnormality. 2. Mild lumbar facet arthropathy. No high-grade stenosis.   PROCEDURES:  Critical Care performed: No  Procedures   MEDICATIONS ORDERED IN ED: Medications  ketorolac (TORADOL) injection 60 mg (60 mg Intramuscular Given 06/30/21 1401)     IMPRESSION / MDM / ASSESSMENT AND PLAN / ED COURSE  I reviewed the triage vital signs and the nursing notes.                              Differential diagnosis includes, but is not limited to, lumbar strain, DDD, facet arthropathy, lumbar radiculopathy  Patient with a history of chronic low back pain, presents to the ED with 1 week of increased low back pain and stiffness.  Patient without any red flags on exam, denies any new or concerning symptoms.  Patient's diagnosis is consistent with lumbar strain and facet arthropathy. Patient will be discharged home with prescriptions for Flexeril and diclofenac. Patient is to follow up with his primary provider as  needed or otherwise directed. Patient is given ED precautions to return to the ED for any worsening or new symptoms.   FINAL CLINICAL IMPRESSION(S) / ED DIAGNOSES   Final diagnoses:  Chronic midline low back pain without sciatica  Facet arthropathy, lumbar     Rx / DC Orders   ED Discharge Orders          Ordered    cyclobenzaprine (FLEXERIL) 5 MG tablet  Daily at bedtime        06/30/21 1353    diclofenac (VOLTAREN) 75 MG EC tablet  2 times daily        06/30/21 1353             Note:  This document was prepared using Dragon voice recognition software and may include unintentional dictation errors.    Lissa Hoard, PA-C 06/30/21 1437    Minna Antis, MD 06/30/21 1528

## 2021-07-06 ENCOUNTER — Telehealth: Payer: Self-pay | Admitting: Student

## 2021-07-06 DIAGNOSIS — G4733 Obstructive sleep apnea (adult) (pediatric): Secondary | ICD-10-CM

## 2021-07-07 NOTE — Telephone Encounter (Signed)
Tried calling pt and there was no answer. I think we held off ordering CPAP at ov due to insurance issue? Will try calling him back next wk, was unable to leave VM.

## 2021-07-10 NOTE — Telephone Encounter (Signed)
Patient is returning phone call. Patient phone number is [phone].

## 2021-07-11 NOTE — Telephone Encounter (Signed)
Spoke with the pt  He has received his health insurance and he wants CPAP order sent  I have placed order for this  Advised to call if has not heard something by end of wk

## 2021-07-12 ENCOUNTER — Emergency Department (HOSPITAL_COMMUNITY): Payer: 59

## 2021-07-12 ENCOUNTER — Encounter (HOSPITAL_COMMUNITY): Payer: Self-pay

## 2021-07-12 ENCOUNTER — Emergency Department (HOSPITAL_COMMUNITY)
Admission: EM | Admit: 2021-07-12 | Discharge: 2021-07-12 | Disposition: A | Discharge status: Home / Self Care | Payer: 59 | Attending: Emergency Medicine | Admitting: Emergency Medicine

## 2021-07-12 ENCOUNTER — Other Ambulatory Visit: Payer: Self-pay

## 2021-07-12 DIAGNOSIS — Z87891 Personal history of nicotine dependence: Secondary | ICD-10-CM | POA: Insufficient documentation

## 2021-07-12 DIAGNOSIS — J45909 Unspecified asthma, uncomplicated: Secondary | ICD-10-CM | POA: Diagnosis not present

## 2021-07-12 DIAGNOSIS — I10 Essential (primary) hypertension: Secondary | ICD-10-CM | POA: Diagnosis not present

## 2021-07-12 DIAGNOSIS — R079 Chest pain, unspecified: Secondary | ICD-10-CM | POA: Diagnosis not present

## 2021-07-12 DIAGNOSIS — R0789 Other chest pain: Secondary | ICD-10-CM | POA: Diagnosis not present

## 2021-07-12 HISTORY — DX: Unspecified osteoarthritis, unspecified site: M19.90

## 2021-07-12 MED ORDER — SUCRALFATE 1 G PO TABS: 1.0000 g | ORAL_TABLET | Freq: Four times a day (QID) | ORAL | 0 refills | Status: DC | PRN

## 2021-07-12 MED ORDER — OMEPRAZOLE 20 MG PO CPDR: 20.0000 mg | DELAYED_RELEASE_CAPSULE | Freq: Every day | ORAL | 1 refills | Status: DC

## 2021-07-12 NOTE — ED Triage Notes (Addendum)
Pt reports with center chest tightness since last night. Pt reports not taking his blood pressure medications. Pt states that it is worsened by lying down.

## 2021-07-12 NOTE — Discharge Instructions (Signed)
You were evaluated in the Emergency Department and after careful evaluation, we did not find any emergent condition requiring admission or further testing in the hospital.  Your exam/testing today is overall reassuring.  Symptoms seem to be due to acid reflux.  Can use the omeprazole and Carafate as we discussed, recommend follow-up with a primary care doctor.  Please return to the Emergency Department if you experience any worsening of your condition.   Thank you for allowing [place] to be a part of your care.

## 2021-07-12 NOTE — ED Provider Notes (Signed)
[facility] Emergency Department Provider Note MRN:  [id number]  Arrival date & time: 07/12/21     Chief Complaint   Chest Pain   History of Present Illness   Steven Diaz is a 26 y.o. year-old male with a history of bipolar disorder, hypertension presenting to the ED with chief complaint of chest pain.  Pain in the chest this evening when lying flat, unable to sleep.  Pain resolves when he sits up.  No shortness of breath, no nausea vomiting, no dizziness or diaphoresis, no leg pain or swelling recently.  Review of Systems  A thorough review of systems was obtained and all systems are negative except as noted in the HPI and PMH.   Patient's Health History    Past Medical History:  Diagnosis Date   Arthritis    Asthma    Bipolar 1 disorder ([place])    Hypertension    PTSD (post-traumatic stress disorder)     History reviewed. No pertinent surgical history.  Family History  Problem Relation Age of Onset   COPD Mother    Heart disease Mother    Cancer Mother    Heart disease Father    Diabetes Maternal Grandmother    Diabetes Maternal Grandfather    Diabetes Paternal Grandmother    Diabetes Paternal Grandfather     Social History   Socioeconomic History   Marital status: Legally Separated    Spouse name: Not on file   Number of children: Not on file   Years of education: Not on file   Highest education level: Not on file  Occupational History   Not on file  Tobacco Use   Smoking status: Former    Packs/day: 1.00    Years: 15.00    Pack years: 15.00    Types: Cigarettes    Quit date: 02/12/2020    Years since quitting: 1.4   Smokeless tobacco: Never  Vaping Use   Vaping Use: Some days   Start date: 02/06/2020   Substances: Nicotine, Flavoring  Substance and Sexual Activity   Alcohol use: Not Currently   Drug use: Never   Sexual activity: Yes  Other Topics Concern   Not on file  Social History Narrative   Not on file   Social  Determinants of Health   Financial Resource Strain: Not on file  Food Insecurity: Not on file  Transportation Needs: Not on file  Physical Activity: Not on file  Stress: Not on file  Social Connections: Not on file  Intimate Partner Violence: Not on file     Physical Exam   Vitals:   07/12/21 0530  BP: (!) 144/87  Pulse: 97  Resp: 16  Temp: 97.9 F (36.6 C)  SpO2: 100%    CONSTITUTIONAL: Well-appearing, NAD NEURO/PSYCH:  Alert and oriented x 3, no focal deficits EYES:  eyes equal and reactive ENT/NECK:  no LAD, no JVD CARDIO: Regular rate, well-perfused, normal S1 and S2 PULM:  CTAB no wheezing or rhonchi GI/GU:  non-distended, non-tender MSK/SPINE:  No gross deformities, no edema SKIN:  no rash, atraumatic   *Additional and/or pertinent findings included in MDM below  Diagnostic and Interventional Summary    EKG Interpretation  Date/Time:  Wednesday July 12 2021 05:35:35 EDT Ventricular Rate:  88 PR Interval:  174 QRS Duration: 113 QT Interval:  338 QTC Calculation: 409 R Axis:   31 Text Interpretation: Sinus rhythm Incomplete right bundle branch block Low voltage, precordial leads Confirmed by Gerlene Fee [phone]) on  07/12/2021 5:47:09 AM       Labs Reviewed - No data to display  DG Chest 2 View  Final Result      Medications - No data to display   Procedures  /  Critical Care Procedures  ED Course and Medical Decision Making  Initial Impression and Ddx Given the positional nature of the chest pain, GI etiology is favored.  Patient is overweight but otherwise has no significant cardiovascular risk factors.  EKG is reassuring with no concerning ischemic features, awaiting chest x-ray.  Anticipating discharge.  Past medical/surgical history that increases complexity of ED encounter: None  Interpretation of Diagnostics I personally reviewed the EKG and my interpretation is as follows: Sinus rhythm without concerning ischemic features  Chest x-ray is  normal  Patient Reassessment and Ultimate Disposition/Management     Appropriate for discharge.  Patient management required discussion with the following services or consulting groups:  None  Complexity of Problems Addressed Acute illness or injury that poses threat of life of bodily function  Additional Data Reviewed and Analyzed Further history obtained from: Further history from spouse/family member  Additional Factors Impacting ED Encounter Risk Prescriptions  Barth Kirks. Sedonia Small, JAARS [email or web address]  Final Clinical Impressions(s) / ED Diagnoses     ICD-10-CM   1. Chest pain, unspecified type  R07.9       ED Discharge Orders          Ordered    omeprazole (PRILOSEC) 20 MG capsule  Daily        07/12/21 0620    sucralfate (CARAFATE) 1 g tablet  4 times daily PRN        07/12/21 0620             Discharge Instructions Discussed with and Provided to Patient:     Discharge Instructions      You were evaluated in the Emergency Department and after careful evaluation, we did not find any emergent condition requiring admission or further testing in the hospital.  Your exam/testing today is overall reassuring.  Symptoms seem to be due to acid reflux.  Can use the omeprazole and Carafate as we discussed, recommend follow-up with a primary care doctor.  Please return to the Emergency Department if you experience any worsening of your condition.   Thank you for allowing [place] to be a part of your care.       Maudie Flakes, MD 07/12/21 [phone]

## 2021-07-12 NOTE — ED Notes (Signed)
Pt C/O chest pain. Around 2am that started gradual with rapid heart beat. Pt was playing a video game. Pt thought it may have been anxiety and advised he took his anxiety meds and it didn't help so he thought he needed to get it checked out. Pt has 7 out of 10 on pain. Nothing makes it better or worse. Pt also seems agitated with his visitor as well and seems to be under some stress.

## 2021-07-13 ENCOUNTER — Encounter (HOSPITAL_COMMUNITY): Payer: Self-pay

## 2021-07-13 ENCOUNTER — Emergency Department (HOSPITAL_COMMUNITY)
Admission: EM | Admit: 2021-07-13 | Discharge: 2021-07-13 | Disposition: A | Discharge status: Home / Self Care | Payer: 59 | Attending: Emergency Medicine | Admitting: Emergency Medicine

## 2021-07-13 ENCOUNTER — Other Ambulatory Visit: Payer: Self-pay

## 2021-07-13 ENCOUNTER — Emergency Department (HOSPITAL_COMMUNITY): Payer: 59

## 2021-07-13 DIAGNOSIS — R0789 Other chest pain: Secondary | ICD-10-CM | POA: Diagnosis not present

## 2021-07-13 DIAGNOSIS — R002 Palpitations: Secondary | ICD-10-CM | POA: Diagnosis not present

## 2021-07-13 DIAGNOSIS — I10 Essential (primary) hypertension: Secondary | ICD-10-CM | POA: Insufficient documentation

## 2021-07-13 DIAGNOSIS — R079 Chest pain, unspecified: Secondary | ICD-10-CM | POA: Diagnosis not present

## 2021-07-13 DIAGNOSIS — R69 Illness, unspecified: Secondary | ICD-10-CM | POA: Diagnosis not present

## 2021-07-13 DIAGNOSIS — R0602 Shortness of breath: Secondary | ICD-10-CM | POA: Insufficient documentation

## 2021-07-13 DIAGNOSIS — F419 Anxiety disorder, unspecified: Secondary | ICD-10-CM | POA: Diagnosis not present

## 2021-07-13 DIAGNOSIS — D72829 Elevated white blood cell count, unspecified: Secondary | ICD-10-CM | POA: Diagnosis not present

## 2021-07-13 LAB — CBC WITH DIFFERENTIAL/PLATELET
Abs Immature Granulocytes: 0.02 10*3/uL (ref 0.00–0.07)
Basophils Absolute: 0 10*3/uL (ref 0.0–0.1)
Basophils Relative: 0 %
Eosinophils Absolute: 0.2 10*3/uL (ref 0.0–0.5)
Eosinophils Relative: 2 %
HCT: 43.1 % (ref 39.0–52.0)
Hemoglobin: 14 g/dL (ref 13.0–17.0)
Immature Granulocytes: 0 %
Lymphocytes Relative: 35 %
Lymphs Abs: 4.1 10*3/uL — ABNORMAL HIGH (ref 0.7–4.0)
MCH: 26.5 pg (ref 26.0–34.0)
MCHC: 32.5 g/dL (ref 30.0–36.0)
MCV: 81.5 fL (ref 80.0–100.0)
Monocytes Absolute: 0.7 10*3/uL (ref 0.1–1.0)
Monocytes Relative: 6 %
Neutro Abs: 6.7 10*3/uL (ref 1.7–7.7)
Neutrophils Relative %: 57 %
Platelets: 319 10*3/uL (ref 150–400)
RBC: 5.29 MIL/uL (ref 4.22–5.81)
RDW: 14.1 % (ref 11.5–15.5)
WBC: 11.7 10*3/uL — ABNORMAL HIGH (ref 4.0–10.5)
nRBC: 0 % (ref 0.0–0.2)

## 2021-07-13 LAB — TROPONIN I (HIGH SENSITIVITY)
Troponin I (High Sensitivity): 3 ng/L (ref <18)
Troponin I (High Sensitivity): 3 ng/L (ref <18)

## 2021-07-13 LAB — COMPREHENSIVE METABOLIC PANEL
ALT: 39 U/L (ref 0–44)
AST: 26 U/L (ref 15–41)
Albumin: 3.8 g/dL (ref 3.5–5.0)
Alkaline Phosphatase: 79 U/L (ref 38–126)
Anion gap: 9 (ref 5–15)
BUN: 11 mg/dL (ref 6–20)
CO2: 23 mmol/L (ref 22–32)
Calcium: 9.5 mg/dL (ref 8.9–10.3)
Chloride: 108 mmol/L (ref 98–111)
Creatinine, Ser: 0.79 mg/dL (ref 0.61–1.24)
GFR, Estimated: >60 mL/min (ref >60)
Glucose, Bld: 123 mg/dL — ABNORMAL HIGH (ref 70–99)
Potassium: 3.6 mmol/L (ref 3.5–5.1)
Sodium: 140 mmol/L (ref 135–145)
Total Bilirubin: 0.5 mg/dL (ref 0.3–1.2)
Total Protein: 7.1 g/dL (ref 6.5–8.1)

## 2021-07-13 LAB — D-DIMER, QUANTITATIVE: D-Dimer, Quant: <0.27 ug/mL-FEU (ref 0.00–0.50)

## 2021-07-13 LAB — TSH: TSH: 3.281 u[IU]/mL (ref 0.350–4.500)

## 2021-07-13 MED ORDER — PROPRANOLOL HCL 10 MG PO TABS: 10.0000 mg | ORAL_TABLET | Freq: Two times a day (BID) | ORAL | 0 refills | Status: DC | PRN

## 2021-07-13 MED ORDER — SODIUM CHLORIDE 0.9 % IV BOLUS
1000.0000 mL | Freq: Once | INTRAVENOUS | Status: AC
  Administered 2021-07-13: 1000 mL via INTRAVENOUS

## 2021-07-13 NOTE — ED Provider Notes (Signed)
[facility]-EMERGENCY DEPT Provider Note   CSN: [id number] Arrival date & time: 07/13/21  0218     History  Chief Complaint  Patient presents with   Chest Pain    Steven Diaz is a 26 y.o. male with history of hypertension, sleep apnea, depression, bipolar 1 disorder, and PTSD who presents to the emergency department with complaints of palpitations for the past 1 week.  Patient reports palpitations which she further describes a sensation of heart racing with associated chest tightness and shortness of breath which has been waxing and waning.  Occur spontaneously, no specific alleviating or aggravating factors, he is concerned about his heart rate being elevated even at rest.  Concerns regarding difficulty obtaining sleep apnea machine as well as PCP follow-up. His heart starts to race so much at night he cannot sleep. He reports some anxiety, takes atarax without relief @ night. Denies drug or alcohol use.  Denies caffeine use.  Denies vomiting, syncope, dizziness, hemoptysis, unilateral leg pain/swelling, hormone use, history of VTE, history of cancer, recent long travel, or recent surgery.  HPI     Home Medications Prior to Admission medications   Medication Sig Start Date End Date Taking? Authorizing Provider  albuterol (VENTOLIN HFA) 108 (90 Base) MCG/ACT inhaler Inhale 2 puffs into the lungs every 6 (six) hours as needed for wheezing or shortness of breath. NEEDS PASS 05/24/21  Yes Claiborne RiggFleming, Zelda W, NP  diclofenac (VOLTAREN) 75 MG EC tablet Take 1 tablet (75 mg total) by mouth 2 (two) times daily. 06/30/21 07/30/21 Yes Menshew, Charlesetta IvoryJenise V Bacon, PA-C  hydrOXYzine (ATARAX) 10 MG tablet Take 1 tablet (10 mg total) by mouth 3 (three) times daily as needed. NEEDS PASS Patient taking differently: Take 10 mg by mouth 3 (three) times daily as needed for anxiety. NEEDS PASS 05/24/21  Yes Claiborne RiggFleming, Zelda W, NP  Vitamin D, Ergocalciferol, (DRISDOL) 1.25 MG (50000 UNIT) CAPS  capsule Take 1 capsule (50,000 Units total) by mouth every 7 (seven) days. 05/25/21  Yes Claiborne RiggFleming, Zelda W, NP  amLODipine (NORVASC) 5 MG tablet Take 1 tablet (5 mg total) by mouth daily for 90 doses. Patient not taking: Reported on 07/13/2021 03/29/21 06/27/21  Claiborne RiggFleming, Zelda W, NP  cyclobenzaprine (FLEXERIL) 5 MG tablet Take 1-2 tablets (5-10 mg total) by mouth at bedtime. 06/30/21   Menshew, Charlesetta IvoryJenise V Bacon, PA-C  omeprazole (PRILOSEC) 20 MG capsule Take 1 capsule (20 mg total) by mouth daily. Patient not taking: Reported on 07/13/2021 07/12/21   Sabas SousBero, Michael M, MD  sucralfate (CARAFATE) 1 g tablet Take 1 tablet (1 g total) by mouth 4 (four) times daily as needed. Patient not taking: Reported on 07/13/2021 07/12/21   Sabas SousBero, Michael M, MD      Allergies    Perflutren and Risperidone    Review of Systems   Review of Systems  Constitutional:  Negative for fever.  Respiratory:  Positive for shortness of breath.   Cardiovascular:  Positive for chest pain and palpitations. Negative for leg swelling.  Gastrointestinal:  Negative for abdominal pain and vomiting.  Neurological:  Negative for dizziness and syncope.  Psychiatric/Behavioral:  The patient is nervous/anxious.   All other systems reviewed and are negative.   Physical Exam Updated Vital Signs BP 130/81 (BP Location: Left Arm)   Pulse 93   Temp 98.4 F (36.9 C) (Oral)   Resp 17   SpO2 98%  Physical Exam Vitals and nursing note reviewed.  Constitutional:      General: He  is not in acute distress.    Appearance: He is well-developed. He is not toxic-appearing.  HENT:     Head: Normocephalic and atraumatic.  Eyes:     General:        Right eye: No discharge.        Left eye: No discharge.     Conjunctiva/sclera: Conjunctivae normal.  Cardiovascular:     Rate and Rhythm: Normal rate and regular rhythm.     Pulses:          Radial pulses are 2+ on the right side and 2+ on the left side.       Dorsalis pedis pulses are 2+ on the right  side and 2+ on the left side.  Pulmonary:     Effort: No respiratory distress.     Breath sounds: Normal breath sounds. No wheezing or rales.  Abdominal:     General: There is no distension.     Palpations: Abdomen is soft.     Tenderness: There is no abdominal tenderness.  Musculoskeletal:     Cervical back: Neck supple.     Right lower leg: No tenderness. No edema.     Left lower leg: No tenderness. No edema.  Skin:    General: Skin is warm and dry.  Neurological:     Mental Status: He is alert.     Comments: Clear speech.   Psychiatric:        Behavior: Behavior normal.     ED Results / Procedures / Treatments   Labs (all labs ordered are listed, but only abnormal results are displayed) Labs Reviewed  CBC WITH DIFFERENTIAL/PLATELET - Abnormal; Notable for the following components:      Result Value   WBC 11.7 (*)    Lymphs Abs 4.1 (*)    All other components within normal limits  COMPREHENSIVE METABOLIC PANEL  TSH  D-DIMER, QUANTITATIVE (NOT AT [facility])  TROPONIN I (HIGH SENSITIVITY)    EKG EKG Interpretation  Date/Time:  Thursday July 13 2021 02:26:05 EDT Ventricular Rate:  109 PR Interval:  167 QRS Duration: 111 QT Interval:  321 QTC Calculation: 433 R Axis:   84 Text Interpretation: Sinus tachycardia RSR' in V1 or V2, right VCD or RVH Rate is faster Confirmed by Paula Libra ([id number]) on 07/13/2021 2:33:04 AM  Radiology DG Chest 2 View  Result Date: 07/13/2021 CLINICAL DATA:  Chest pain. EXAM: CHEST - 2 VIEW COMPARISON:  07/12/2021. FINDINGS: The heart size and mediastinal contours are within normal limits. Both lungs are clear. No acute osseous abnormality. IMPRESSION: No active cardiopulmonary disease. Electronically Signed   By: Thornell Sartorius M.D.   On: 07/13/2021 02:50   DG Chest 2 View  Result Date: 07/12/2021 CLINICAL DATA:  Chest pain EXAM: CHEST - 2 VIEW COMPARISON:  03/03/2021 FINDINGS: Heart size and mediastinal contours are normal. No pleural effusion  or edema. No airspace opacities identified. Visualized osseous structures are intact. IMPRESSION: No active cardiopulmonary abnormalities. Electronically Signed   By: Signa Kell M.D.   On: 07/12/2021 06:05    Procedures Procedures    Medications Ordered in ED Medications  sodium chloride 0.9 % bolus 1,000 mL (1,000 mLs Intravenous New Bag/Given 07/13/21 0329)    ED Course/ Medical Decision Making/ A&P                           Medical Decision Making Amount and/or Complexity of Data Reviewed Labs: ordered. Radiology: ordered.  Patient presents to the emergency department with complaints of palpitations.  He is nontoxic, resting comfortably, initially mildly tachycardic, improved on my exam.  External records reviewed, seen in the ED yesterday with somewhat similar complaints, also has been seen by cardiology, had an echocardiogram earlier this year with normal EF.  EKG with sinus tachycardia  5:40 AM Cardiac monitoring reveals 88 beats per minute sinus rhythm as reviewed and interpreted by me. Cardiac monitoring was ordered due to palpitations and to monitor patient for dysrhythmia.  I ordered, reviewed, and interpreted labs including: CBC: Mild leukocytosis felt to be nonspecific CMP: Grossly unremarkable TSH: Within normal limits D-dimer: Within normal limits Troponin: Within normal limits and flat.  I viewed and interpreted chest x-ray, agree with radiologist read: No active cardiopulmonary disease.  Patient given fluids in the emergency department.  His chest x-ray does not show findings of pneumothorax, pneumonia, or CHF.  His troponins are flat and his EKG does not appear to have acute ischemia, doubt ACS.  Low risk Wells, D-dimer negative, doubt PE.  No critical electrolyte derangement, thyroid derangement, or anemia.  Cardiac monitor reviewed, has remained in sinus rhythm throughout emergency department visit.  He overall seems appropriate for discharge at this time.   Will trial very short course of low-dose propranolol for his symptoms, advised very close primary care follow-up. I discussed results, treatment plan, need for follow-up, and return precautions with the patient. Provided opportunity for questions, patient confirmed understanding and is in agreement with plan.   Findings and plan of care discussed with supervising physician Dr. Read Drivers who is in agreement.   Final Clinical Impression(s) / ED Diagnoses Final diagnoses:  Palpitations    Rx / DC Orders ED Discharge Orders          Ordered    propranolol (INDERAL) 10 MG tablet  2 times daily PRN        07/13/21 0550              Tishara Pizano, Pleas Koch, PA-C 07/13/21 0555    Molpus, Jonny Ruiz, MD 07/13/21 [phone]

## 2021-07-13 NOTE — ED Notes (Signed)
Unsuccessful lab draw. Pt advises he is a difficult stick. Triage RN notified.

## 2021-07-13 NOTE — ED Triage Notes (Signed)
Pt states that he has been having chest pain all day and reports an elevated hr all day. Pt states that he cannot sleep at night due to being uncomfortable from chest pressure.

## 2021-07-13 NOTE — ED Notes (Signed)
Pt endorses continued sensation of "fluttering" in his chest

## 2021-07-13 NOTE — Discharge Instructions (Addendum)
You were seen in the emergency department tonight for palpitations, chest pain, and trouble breathing.  Your work-up in the emergency department was overall reassuring.  Your chest x-ray was normal.  Your EKG showed sinus tachycardia.  Your labs did not show any significant abnormalities, your white blood cell count was very mildly elevated, please have this rechecked by your primary care provider.  Your electrolytes, thyroid test, and hemoglobin were all normal.  Your D-dimer which looks for a blood clot was normal.  Your heart enzymes looking for heart attack were normal.  We would like you to try taking metoprolol 1 tablet twice per day as needed for palpitations.  We have prescribed you new medication(s) today. Discuss the medications prescribed today with your pharmacist as they can have adverse effects and interactions with your other medicines including over the counter and prescribed medications. Seek medical evaluation if you start to experience new or abnormal symptoms after taking one of these medicines, seek care immediately if you start to experience difficulty breathing, feeling of your throat closing, facial swelling, or rash as these could be indications of a more serious allergic reaction  Please call your primary care provider today to try to schedule closer follow-up for soon as possible.  Return to the emergency department for new or worsening symptoms or any other concerns.

## 2021-07-16 DIAGNOSIS — M545 Low back pain, unspecified: Secondary | ICD-10-CM | POA: Diagnosis not present

## 2021-07-16 DIAGNOSIS — G8929 Other chronic pain: Secondary | ICD-10-CM | POA: Diagnosis not present

## 2021-07-17 ENCOUNTER — Encounter: Payer: Self-pay | Admitting: Student

## 2021-08-02 NOTE — Telephone Encounter (Signed)
Please refer to mychart encounter from 6/12.

## 2021-08-09 ENCOUNTER — Telehealth: Payer: Self-pay | Admitting: Student

## 2021-08-10 NOTE — Telephone Encounter (Signed)
Called the pt and there was no answer, VM was full, will call back.

## 2021-08-25 ENCOUNTER — Encounter: Payer: Self-pay | Admitting: Radiology

## 2021-08-25 ENCOUNTER — Encounter: Payer: Self-pay | Admitting: Student

## 2021-08-25 ENCOUNTER — Other Ambulatory Visit: Payer: Self-pay

## 2021-08-25 ENCOUNTER — Emergency Department
Admission: EM | Admit: 2021-08-25 | Discharge: 2021-08-25 | Disposition: A | Discharge status: Home / Self Care | Payer: 59 | Attending: Emergency Medicine | Admitting: Emergency Medicine

## 2021-08-25 ENCOUNTER — Emergency Department: Payer: 59

## 2021-08-25 DIAGNOSIS — R109 Unspecified abdominal pain: Secondary | ICD-10-CM | POA: Diagnosis not present

## 2021-08-25 DIAGNOSIS — G4733 Obstructive sleep apnea (adult) (pediatric): Secondary | ICD-10-CM

## 2021-08-25 DIAGNOSIS — I10 Essential (primary) hypertension: Secondary | ICD-10-CM | POA: Insufficient documentation

## 2021-08-25 DIAGNOSIS — J45909 Unspecified asthma, uncomplicated: Secondary | ICD-10-CM | POA: Diagnosis not present

## 2021-08-25 DIAGNOSIS — R1031 Right lower quadrant pain: Secondary | ICD-10-CM | POA: Diagnosis not present

## 2021-08-25 LAB — COMPREHENSIVE METABOLIC PANEL
ALT: 62 U/L — ABNORMAL HIGH (ref 0–44)
AST: 38 U/L (ref 15–41)
Albumin: 3.7 g/dL (ref 3.5–5.0)
Alkaline Phosphatase: 75 U/L (ref 38–126)
Anion gap: 6 (ref 5–15)
BUN: 8 mg/dL (ref 6–20)
CO2: 24 mmol/L (ref 22–32)
Calcium: 9.2 mg/dL (ref 8.9–10.3)
Chloride: 108 mmol/L (ref 98–111)
Creatinine, Ser: 0.7 mg/dL (ref 0.61–1.24)
GFR, Estimated: >60 mL/min (ref >60)
Glucose, Bld: 104 mg/dL — ABNORMAL HIGH (ref 70–99)
Potassium: 3.8 mmol/L (ref 3.5–5.1)
Sodium: 138 mmol/L (ref 135–145)
Total Bilirubin: 0.2 mg/dL — ABNORMAL LOW (ref 0.3–1.2)
Total Protein: 6.8 g/dL (ref 6.5–8.1)

## 2021-08-25 LAB — URINALYSIS, ROUTINE W REFLEX MICROSCOPIC
Bilirubin Urine: NEGATIVE
Glucose, UA: NEGATIVE mg/dL
Hgb urine dipstick: NEGATIVE
Ketones, ur: NEGATIVE mg/dL
Leukocytes,Ua: NEGATIVE
Nitrite: NEGATIVE
Protein, ur: NEGATIVE mg/dL
Specific Gravity, Urine: 1.025 (ref 1.005–1.030)
pH: 5 (ref 5.0–8.0)

## 2021-08-25 LAB — CBC WITH DIFFERENTIAL/PLATELET
Abs Immature Granulocytes: 0.01 10*3/uL (ref 0.00–0.07)
Basophils Absolute: 0 10*3/uL (ref 0.0–0.1)
Basophils Relative: 0 %
Eosinophils Absolute: 0.2 10*3/uL (ref 0.0–0.5)
Eosinophils Relative: 3 %
HCT: 43.3 % (ref 39.0–52.0)
Hemoglobin: 13.8 g/dL (ref 13.0–17.0)
Immature Granulocytes: 0 %
Lymphocytes Relative: 44 %
Lymphs Abs: 3.3 10*3/uL (ref 0.7–4.0)
MCH: 25.8 pg — ABNORMAL LOW (ref 26.0–34.0)
MCHC: 31.9 g/dL (ref 30.0–36.0)
MCV: 81.1 fL (ref 80.0–100.0)
Monocytes Absolute: 0.6 10*3/uL (ref 0.1–1.0)
Monocytes Relative: 8 %
Neutro Abs: 3.3 10*3/uL (ref 1.7–7.7)
Neutrophils Relative %: 45 %
Platelets: 355 10*3/uL (ref 150–400)
RBC: 5.34 MIL/uL (ref 4.22–5.81)
RDW: 14.4 % (ref 11.5–15.5)
WBC: 7.4 10*3/uL (ref 4.0–10.5)
nRBC: 0 % (ref 0.0–0.2)

## 2021-08-25 MED ORDER — NAPROXEN 500 MG PO TABS: 500.0000 mg | ORAL_TABLET | Freq: Two times a day (BID) | ORAL | 0 refills | Status: DC

## 2021-08-25 MED ORDER — SODIUM CHLORIDE 0.9 % IV BOLUS
1000.0000 mL | Freq: Once | INTRAVENOUS | Status: AC
  Administered 2021-08-25: 1000 mL via INTRAVENOUS

## 2021-08-25 MED ORDER — IOHEXOL 350 MG/ML SOLN
100.0000 mL | Freq: Once | INTRAVENOUS | Status: AC | PRN
  Administered 2021-08-25: 100 mL via INTRAVENOUS

## 2021-08-25 MED ORDER — ONDANSETRON HCL 4 MG/2ML IJ SOLN
4.0000 mg | Freq: Once | INTRAMUSCULAR | Status: AC
  Administered 2021-08-25: 4 mg via INTRAVENOUS
  Filled 2021-08-25: qty 2

## 2021-08-25 MED ORDER — FENTANYL CITRATE PF 50 MCG/ML IJ SOSY
50.0000 ug | PREFILLED_SYRINGE | Freq: Once | INTRAMUSCULAR | Status: AC
  Administered 2021-08-25: 50 ug via INTRAVENOUS
  Filled 2021-08-25: qty 1

## 2021-08-25 MED ORDER — METHOCARBAMOL 750 MG PO TABS: 750.0000 mg | ORAL_TABLET | Freq: Three times a day (TID) | ORAL | 0 refills | Status: DC | PRN

## 2021-08-25 NOTE — ED Triage Notes (Signed)
Pt presents to ED with RLQ pain that has been ongoing for the 3rd day. Pt states he came into due to pain. Pt states he still has an appendix. Pt c/o of dysuria and HX of UTI's and pt states also reports increased urine frequency. NAD noted. Pt ambulatory with steady gait to triage.

## 2021-08-25 NOTE — Discharge Instructions (Signed)
Please follow up with primary care if not improving over the weekend.  If your symptoms change or worsen, return to the ER.

## 2021-08-25 NOTE — ED Provider Notes (Signed)
[facility] Provider Note    Event Date/Time   First MD Initiated Contact with Patient 08/25/21 1258     (approximate)   History   Abdominal Pain   HPI  Steven Diaz is a 26 y.o. male presents to the emergency department for treatment and evaluation of right lower quadrant pain that has been present and progressively worsening for the past 3 days.  He is also having some dysuria with a history of urinary tract infections. No fever, but has felt like his body is hotter than usual. He is also having some nausea and has had 2 episodes of vomiting with the last being yesterday. No recent UTI or antibiotic.   Past Medical History:  Diagnosis Date   Arthritis    Asthma    Bipolar 1 disorder (HCC)    Hypertension    PTSD (post-traumatic stress disorder)      Physical Exam   Triage Vital Signs: ED Triage Vitals  Enc Vitals Group     BP 08/25/21 1213 131/82     Pulse Rate 08/25/21 1213 92     Resp 08/25/21 1213 17     Temp 08/25/21 1213 98.7 F (37.1 C)     Temp Source 08/25/21 1213 Oral     SpO2 08/25/21 1213 97 %     Weight --      Height --      Head Circumference --      Peak Flow --      Pain Score 08/25/21 1214 9     Pain Loc --      Pain Edu? --      Excl. in GC? --     Most recent vital signs: Vitals:   08/25/21 1621 08/25/21 1645  BP: (!) 116/56 (!) 116/56  Pulse: 69 72  Resp: 20 18  Temp:  98 F (36.7 C)  SpO2: 99%     General: Awake, no distress.  CV:  Good peripheral perfusion.  Resp:  Normal effort.  Abd:  No distention.  Other:  Rebound RLQ tenderness   ED Results / Procedures / Treatments   Labs (all labs ordered are listed, but only abnormal results are displayed) Labs Reviewed  CBC WITH DIFFERENTIAL/PLATELET - Abnormal; Notable for the following components:      Result Value   MCH 25.8 (*)    All other components within normal limits  COMPREHENSIVE METABOLIC PANEL - Abnormal; Notable for the following  components:   Glucose, Bld 104 (*)    ALT 62 (*)    Total Bilirubin 0.2 (*)    All other components within normal limits  URINALYSIS, ROUTINE W REFLEX MICROSCOPIC - Abnormal; Notable for the following components:   Color, Urine YELLOW (*)    APPearance CLEAR (*)    All other components within normal limits     EKG     RADIOLOGY  CT abdomen and pelvis with contrast is negative for acute concerns, most specifically negative for appendicitis.  I have independently reviewed and interpreted imaging as well as reviewed report from radiology.  PROCEDURES:  Critical Care performed: No  Procedures   MEDICATIONS ORDERED IN ED:  Medications  sodium chloride 0.9 % bolus 1,000 mL (0 mLs Intravenous Stopped 08/25/21 1648)  ondansetron (ZOFRAN) injection 4 mg (4 mg Intravenous Given 08/25/21 1332)  fentaNYL (SUBLIMAZE) injection 50 mcg (50 mcg Intravenous Given 08/25/21 1340)  iohexol (OMNIPAQUE) 350 MG/ML injection 100 mL (100 mLs Intravenous Contrast Given 08/25/21 1536)  IMPRESSION / MDM / ASSESSMENT AND PLAN / ED COURSE   I reviewed the triage vital signs and the nursing notes.  Differential diagnosis includes, but is not limited to: Appendicitis, colitis, retained gas, bowel obstruction  Patient's presentation is most consistent with acute presentation with potential threat to life or bodily function.  26 year old male presenting to the emergency department for treatment and evaluation of right lower quadrant pain.  See HPI for further details.  Awaiting lab results.  Plan will be to get a CT of the abdomen and pelvis with contrast to rule out appendicitis.  Patient aware and agreeable to the plan.  CBC shows no leukocytosis.  His ALT is slightly elevated at 62 with a total bilirubin 1.2 but otherwise normal CMP.  Urinalysis is pending.  CT staff report they are running about 2 hours behind in testing, which has delayed CT. Patient's pain controlled after Fentanyl.  CT of  the abdomen and pelvis is negative for acute concerns.  Urinalysis is negative for acute cystitis.  Since appendicitis has been ruled out, I suspect this is musculoskeletal pain.  Patient states this is a possibility since he has been working more than usual.  He does not recall a specific injury but states that pain does increase with full extension of his right lower extremity and when he flexes at the hip. Will treat with naprosyn and robaxin. ER return precautions discussed.      FINAL CLINICAL IMPRESSION(S) / ED DIAGNOSES   Final diagnoses:  Right lower quadrant abdominal pain     Rx / DC Orders   ED Discharge Orders          Ordered    methocarbamol (ROBAXIN-750) 750 MG tablet  Every 8 hours PRN        08/25/21 1633    naproxen (NAPROSYN) 500 MG tablet  2 times daily with meals        08/25/21 1633             Note:  This document was prepared using Dragon voice recognition software and may include unintentional dictation errors.   Chinita Pester, FNP 08/25/21 1732    Gilles Chiquito, MD 08/25/21 [phone]

## 2021-08-28 ENCOUNTER — Telehealth: Payer: Self-pay | Admitting: Student

## 2021-08-28 NOTE — Telephone Encounter (Signed)
Message sent to Dr Thora Lance via Earleen Reaper on 08/28/21. Need to clarify if Dr Thora Lance wants to change cpap settings before we give a copy to pt. Nothing further needed

## 2021-08-28 NOTE — Telephone Encounter (Signed)
Totally fine with me  to give him printed presciption

## 2021-09-02 DIAGNOSIS — F339 Major depressive disorder, recurrent, unspecified: Secondary | ICD-10-CM | POA: Diagnosis not present

## 2021-09-02 DIAGNOSIS — R69 Illness, unspecified: Secondary | ICD-10-CM | POA: Diagnosis not present

## 2021-09-10 ENCOUNTER — Ambulatory Visit: Payer: Self-pay

## 2021-09-10 ENCOUNTER — Encounter: Payer: Self-pay | Admitting: Emergency Medicine

## 2021-09-10 ENCOUNTER — Ambulatory Visit
Admission: EM | Admit: 2021-09-10 | Discharge: 2021-09-10 | Disposition: A | Discharge status: Home / Self Care | Payer: 59 | Attending: Nurse Practitioner | Admitting: Nurse Practitioner

## 2021-09-10 DIAGNOSIS — M545 Low back pain, unspecified: Secondary | ICD-10-CM | POA: Diagnosis not present

## 2021-09-10 DIAGNOSIS — N50819 Testicular pain, unspecified: Secondary | ICD-10-CM | POA: Diagnosis not present

## 2021-09-10 LAB — POCT URINALYSIS DIP (MANUAL ENTRY)
Bilirubin, UA: NEGATIVE
Blood, UA: NEGATIVE
Glucose, UA: NEGATIVE mg/dL
Ketones, POC UA: NEGATIVE mg/dL
Leukocytes, UA: NEGATIVE
Nitrite, UA: NEGATIVE
Protein Ur, POC: NEGATIVE mg/dL
Spec Grav, UA: 1.025 (ref 1.010–1.025)
Urobilinogen, UA: 0.2 E.U./dL
pH, UA: 5 (ref 5.0–8.0)

## 2021-09-10 MED ORDER — DEXAMETHASONE SODIUM PHOSPHATE 10 MG/ML IJ SOLN
10.0000 mg | INTRAMUSCULAR | Status: AC
  Administered 2021-09-10: 10 mg via INTRAMUSCULAR

## 2021-09-10 MED ORDER — PREDNISONE 50 MG PO TABS: ORAL_TABLET | ORAL | 0 refills | Status: DC

## 2021-09-10 MED ORDER — KETOROLAC TROMETHAMINE 30 MG/ML IJ SOLN
30.0000 mg | Freq: Once | INTRAMUSCULAR | Status: AC
  Administered 2021-09-10: 30 mg via INTRAMUSCULAR

## 2021-09-10 NOTE — ED Provider Notes (Signed)
[facility]    CSN: [id number] Arrival date & time: 09/10/21  1140      History   Chief Complaint No chief complaint on file.   HPI Steven Diaz is a 26 y.o. male.   The history is provided by the patient.   Patient presents for complaints of low back pain and testicular pain.  Patient states he has a history of chronic back pain.  He was seen in the emergency department on 08/25/2021 for the same or similar symptoms.  Since that time.  Patient states he has also had testicular/scrotal pain and tenderness.  He states that the pain is associated with his back pain.  States his testicular/scrotal pain has been going on since 2019.  Patient had a CT while in the emergency department.  CT was negative for any acute findings.  Patient states he was given naproxen in methocarbamol for his symptoms which does not provide much relief for his symptoms.  Denies urinary frequency, urgency, hesitancy, hematuria.  States that he does have a history of numbness and tingling in his legs and feet which is present at this time.  He declines STI testing at this time as he states he is not sexually active.  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 No pertinent surgical history.     Home Medications    Prior to Admission medications   Medication Sig Start Date End Date Taking? Authorizing Provider  predniSONE (DELTASONE) 50 MG tablet Take 1 tablet daily with breakfast for 5 days. 09/10/21  Yes Ariani Seier-Warren, Sadie Haber, NP  albuterol (VENTOLIN HFA) 108 (90 Base) MCG/ACT inhaler Inhale 2 puffs into the lungs every 6 (six) hours as needed for wheezing or shortness of breath. NEEDS PASS 05/24/21    Claiborne Rigg, NP  amLODipine (NORVASC) 5 MG tablet Take 1 tablet (5 mg total) by mouth daily for 90 doses. Patient not taking: Reported on 07/13/2021 03/29/21 06/27/21  Claiborne Rigg, NP  cyclobenzaprine (FLEXERIL) 5 MG tablet Take 1-2 tablets (5-10 mg total) by mouth at bedtime. 06/30/21   Menshew, Charlesetta Ivory, PA-C  hydrOXYzine (ATARAX) 10 MG tablet Take 1 tablet (10 mg total) by mouth 3 (three) times daily as needed. NEEDS PASS Patient taking differently: Take 10 mg by mouth 3 (three) times daily as needed for anxiety. NEEDS PASS 05/24/21   Claiborne Rigg, NP  methocarbamol (ROBAXIN-750) 750 MG tablet Take 1 tablet (750 mg total) by mouth every 8 (eight) hours as needed for muscle spasms. 08/25/21   Triplett, Rulon Eisenmenger B, FNP  naproxen (NAPROSYN) 500 MG tablet Take 1 tablet (500 mg total) by mouth 2 (two) times daily with a meal. 08/25/21   Triplett, Cari B, FNP  propranolol (INDERAL) 10 MG tablet Take 1 tablet (10 mg total) by mouth 2 (two) times daily as needed (Palpitations.). 07/13/21   Petrucelli, Samantha R, PA-C  Vitamin D, Ergocalciferol, (DRISDOL) 1.25 MG (50000 UNIT) CAPS capsule Take 1 capsule (50,000 Units total) by mouth every 7 (seven) days. 05/25/21   Claiborne Rigg, NP    Family History Family History  Problem Relation Age of Onset   COPD Mother  Heart disease Mother    Cancer Mother    Heart disease Father    Diabetes Maternal Grandmother    Diabetes Maternal Grandfather    Diabetes Paternal Grandmother    Diabetes Paternal Grandfather     Social History Social History   Tobacco Use   Smoking status: Former    Packs/day: 1.00    Years: 15.00    Total pack years: 15.00    Types: Cigarettes    Quit date: 02/12/2020    Years since quitting: 1.5   Smokeless tobacco: Never  Vaping Use   Vaping Use: Some days   Start date: 02/06/2020   Substances: Nicotine, Flavoring  Substance Use Topics   Alcohol use: Not Currently   Drug use: Never     Allergies    Perflutren and Risperidone   Review of Systems Review of Systems Per HPI  Physical Exam Triage Vital Signs ED Triage Vitals  Enc Vitals Group     BP 09/10/21 1154 121/81     Pulse Rate 09/10/21 1154 (!) 101     Resp 09/10/21 1154 18     Temp 09/10/21 1154 98.1 F (36.7 C)     Temp Source 09/10/21 1154 Oral     SpO2 09/10/21 1154 97 %     Weight --      Height --      Head Circumference --      Peak Flow --      Pain Score 09/10/21 1200 8     Pain Loc --      Pain Edu? --      Excl. in GC? --    No data found.  Updated Vital Signs BP 121/81 (BP Location: Right Arm)   Pulse (!) 101   Temp 98.1 F (36.7 C) (Oral)   Resp 18   SpO2 97%   Visual Acuity Right Eye Distance:   Left Eye Distance:   Bilateral Distance:    Right Eye Near:   Left Eye Near:    Bilateral Near:     Physical Exam Vitals and nursing note reviewed. Exam conducted with a chaperone present Matilde Sprang, CMA).  Constitutional:      Appearance: Normal appearance.  HENT:     Head: Normocephalic and atraumatic.     Nose: Nose normal.  Eyes:     Extraocular Movements: Extraocular movements intact.     Conjunctiva/sclera: Conjunctivae normal.     Pupils: Pupils are equal, round, and reactive to light.  Cardiovascular:     Rate and Rhythm: Normal rate and regular rhythm.     Pulses: Normal pulses.     Heart sounds: Normal heart sounds.  Pulmonary:     Effort: Pulmonary effort is normal.     Breath sounds: Normal breath sounds.  Abdominal:     General: Bowel sounds are normal.     Palpations: Abdomen is soft.  Genitourinary:    Penis: Circumcised. No tenderness, discharge or lesions.      Testes:        Right: Tenderness present. Swelling not present.        Left: Tenderness present. Swelling not present.     Epididymis:     Right: Normal.     Left: Normal.  Musculoskeletal:     Cervical back: Normal range of motion.  Lymphadenopathy:     Cervical: No cervical adenopathy.  Skin:     General: Skin is warm and dry.  Neurological:     General: No focal  deficit present.     Mental Status: He is alert and oriented to person, place, and time.  Psychiatric:        Mood and Affect: Mood normal.        Behavior: Behavior normal.      UC Treatments / Results  Labs (all labs ordered are listed, but only abnormal results are displayed) Labs Reviewed  POCT URINALYSIS DIP (MANUAL ENTRY)    EKG   Radiology No results found.  Procedures Procedures (including critical care time)  Medications Ordered in UC Medications  ketorolac (TORADOL) 30 MG/ML injection 30 mg (30 mg Intramuscular Given 09/10/21 1255)  dexamethasone (DECADRON) injection 10 mg (10 mg Intramuscular Given 09/10/21 1255)    Initial Impression / Assessment and Plan / UC Course  I have reviewed the triage vital signs and the nursing notes.  Pertinent labs & imaging results that were available during my care of the patient were reviewed by me and considered in my medical decision making (see chart for details).  Presents for complaints of worsening chronic low back pain and associated testicular/scrotal pain.  On exam, there are no red flag symptoms noted.  Patient's been seen 1-2 times over the past 2 months for the same or similar symptoms.  There appear to be no changes in his symptoms at this time although he states that the testicular pain is related to his back pain or has since worsened since he developed worsening back pain.  No concern for testicular torsion, epididymitis, or other urological symptoms.  There are no red flag symptoms on his exam today.  Decadron 10 mg IM and Toradol 30 mg IM were given in the clinic.  Urinalysis was negative.  Symptoms are consistent with chronic low back pain and lumbar strain.  Will prescribe prednisone for 5 days to see if this helps with his symptoms.   Patient was advised that due to the chronicity of his conditions, recommend that he follow-up with primary care  physician for further evaluation and possible referral.  Patient was given strict indications of when to return to the emergency department.  Follow-up as needed. Final Clinical Impressions(s) / UC Diagnoses   Final diagnoses:  Low back pain, unspecified back pain laterality, unspecified chronicity, unspecified whether sciatica present  Pain in testicle, unspecified laterality     Discharge Instructions      Urinalysis is negative for any abnormal result. Take medication as prescribed. Recommend use of ice or heat as needed.  Use ice for pain or swelling, heat for spasm or stiffness.  Apply for 20 minutes, remove for 1 hour, then repeat. Gentle stretching and range of motion exercises to help improve mobility.  Try to remain as active as possible. Go to the emergency department immediately if you develop worsening low back pain, loss of bowel or bladder function, inability to walk, or worsening testicular or scrotal pain. As discussed, please follow-up with your primary care physician this week to establish care and for further evaluation.     ED Prescriptions     Medication Sig Dispense Auth. Provider   predniSONE (DELTASONE) 50 MG tablet Take 1 tablet daily with breakfast for 5 days. 5 tablet Triton Heidrich-Warren, Sadie Haber, NP      PDMP not reviewed this encounter.   Abran Cantor, NP 09/10/21 1259

## 2021-09-10 NOTE — ED Triage Notes (Signed)
Lower Back pain that has been going on for a while.  Was seen in ED for same on 7/21.  States he has been pushing himself too hard at work.  Also c/o testicle pain since 2019.  States he has a cyst on both testicles.  States pain for both back and testicle has become worse over the past week and a half.

## 2021-09-10 NOTE — Discharge Instructions (Addendum)
Urinalysis is negative for any abnormal result. Take medication as prescribed. Recommend use of ice or heat as needed.  Use ice for pain or swelling, heat for spasm or stiffness.  Apply for 20 minutes, remove for 1 hour, then repeat. Gentle stretching and range of motion exercises to help improve mobility.  Try to remain as active as possible. Go to the emergency department immediately if you develop worsening low back pain, loss of bowel or bladder function, inability to walk, or worsening testicular or scrotal pain. As discussed, please follow-up with your primary care physician this week to establish care and for further evaluation.

## 2021-09-12 ENCOUNTER — Telehealth: Payer: Self-pay | Admitting: Emergency Medicine

## 2021-09-14 ENCOUNTER — Encounter: Payer: Self-pay | Admitting: Physician Assistant

## 2021-09-14 ENCOUNTER — Ambulatory Visit: Payer: 59 | Attending: Physician Assistant | Admitting: Physician Assistant

## 2021-09-14 VITALS — BP 132/84 | HR 109 | Ht 75.0 in | Wt >= 6400 oz

## 2021-09-14 DIAGNOSIS — Z09 Encounter for follow-up examination after completed treatment for conditions other than malignant neoplasm: Secondary | ICD-10-CM | POA: Diagnosis not present

## 2021-09-14 DIAGNOSIS — F32A Depression, unspecified: Secondary | ICD-10-CM | POA: Diagnosis not present

## 2021-09-14 DIAGNOSIS — N50812 Left testicular pain: Secondary | ICD-10-CM | POA: Diagnosis not present

## 2021-09-14 DIAGNOSIS — R69 Illness, unspecified: Secondary | ICD-10-CM | POA: Diagnosis not present

## 2021-09-14 DIAGNOSIS — F419 Anxiety disorder, unspecified: Secondary | ICD-10-CM

## 2021-09-14 DIAGNOSIS — N50811 Right testicular pain: Secondary | ICD-10-CM | POA: Diagnosis not present

## 2021-09-14 DIAGNOSIS — M545 Low back pain, unspecified: Secondary | ICD-10-CM

## 2021-09-14 DIAGNOSIS — Z1331 Encounter for screening for depression: Secondary | ICD-10-CM | POA: Diagnosis not present

## 2021-09-14 DIAGNOSIS — G8929 Other chronic pain: Secondary | ICD-10-CM

## 2021-09-14 MED ORDER — CYCLOBENZAPRINE HCL 5 MG PO TABS: 5.0000 mg | ORAL_TABLET | Freq: Every day | ORAL | 2 refills | Status: DC

## 2021-09-14 MED ORDER — SERTRALINE HCL 50 MG PO TABS: 50.0000 mg | ORAL_TABLET | Freq: Every day | ORAL | 3 refills | Status: DC

## 2021-09-14 NOTE — Progress Notes (Signed)
Patient ID: Steven Diaz, male   DOB: Dec 20, 1995, 26 y.o.   MRN: [id number]    Steven Diaz, is a 26 y.o. male  SWH:[id number]  YKZ:[id number]  DOB - 1995/05/09  No chief complaint on file.      Subjective:   Steven Diaz is a 26 y.o. male here today for a follow up visit after being Seen at ED 09/10/2021.  He continues to have testicular pain that has been going on for about 1 year.  This has been worked up in the ED a few times.  He would like to see urology.  He denies acute pain today.  His testicles feel tight and occasionally have sharp pains.  No urinary s/sx.  No SA currently.  He and his partner recently broke up.  He went to work at [employer] about 1-2 weeks ago.  Back pain has worsened and would like to see a specialist due to to lumbar facet arthropathy shown on CT L-S spine.  Voltaren and naproxen did not help.  Actually advil has helped and flexeril helped but he is out of that.  Denies radiculopathy.    He has a therapist who has suggested medication.  He is concerned about weight gain.  He just had a significant weight gain on prednisone.  Admits to drinking a lot of sodas.  He denies SI/HI.  He is open to starting a medication.    He is still living with the partner that he has broken up with.  Relationship amicable and they are both hoping for reconciliation.    From ED note: Patient presents for complaints of low back pain and testicular pain.  Patient states he has a history of chronic back pain.  He was seen in the emergency department on 08/25/2021 for the same or similar symptoms.  Since that time.  Patient states he has also had testicular/scrotal pain and tenderness.  He states that the pain is associated with his back pain.  States his testicular/scrotal pain has been going on since 2019.  Patient had a CT while in the emergency department.  CT was negative for any acute findings.  Patient states he was given naproxen in methocarbamol for his symptoms which  does not provide much relief for his symptoms.  Denies urinary frequency, urgency, hesitancy, hematuria.  States that he does have a history of numbness and tingling in his legs and feet which is present at this time.  He declines STI testing at this time as he states he is not sexually active.  Presents for complaints of worsening chronic low back pain and associated testicular/scrotal pain.  On exam, there are no red flag symptoms noted.  Patient's been seen 1-2 times over the past 2 months for the same or similar symptoms.  There appear to be no changes in his symptoms at this time although he states that the testicular pain is related to his back pain or has since worsened since he developed worsening back pain.  No concern for testicular torsion, epididymitis, or other urological symptoms.  There are no red flag symptoms on his exam today.  Decadron 10 mg IM and Toradol 30 mg IM were given in the clinic.  Urinalysis was negative.  Symptoms are consistent with chronic low back pain and lumbar strain.  Will prescribe prednisone for 5 days to see if this helps with his symptoms.   Patient was advised that due to the chronicity of his conditions, recommend that he follow-up with primary care  physician for further evaluation and possible referral.  Patient was given strict indications of when to return to the emergency department.  Follow-up as needed.   No problems updated.  ALLERGIES: Allergies  Allergen Reactions   Perflutren Other (See Comments)    Pain that "radiated through whole body"   Risperidone Other (See Comments)    Gynecomastia.     PAST MEDICAL HISTORY: Past Medical History:  Diagnosis Date   Arthritis    Asthma    Bipolar 1 disorder ([place])    Hypertension    PTSD (post-traumatic stress disorder)     MEDICATIONS AT HOME: Prior to Admission medications   Medication Sig Start Date End Date Taking? Authorizing Provider  sertraline (ZOLOFT) 50 MG tablet Take 1 tablet (50 mg  total) by mouth daily. 09/14/21  Yes Florabelle Cardin, Dionne Bucy, PA-C  albuterol (VENTOLIN HFA) 108 (90 Base) MCG/ACT inhaler Inhale 2 puffs into the lungs every 6 (six) hours as needed for wheezing or shortness of breath. NEEDS PASS 05/24/21   Gildardo Pounds, NP  amLODipine (NORVASC) 5 MG tablet Take 1 tablet (5 mg total) by mouth daily for 90 doses. Patient not taking: Reported on 07/13/2021 03/29/21 06/27/21  Gildardo Pounds, NP  cyclobenzaprine (FLEXERIL) 5 MG tablet Take 1-2 tablets (5-10 mg total) by mouth at bedtime. 09/14/21   Argentina Donovan, PA-C  hydrOXYzine (ATARAX) 10 MG tablet Take 1 tablet (10 mg total) by mouth 3 (three) times daily as needed. NEEDS PASS Patient taking differently: Take 10 mg by mouth 3 (three) times daily as needed for anxiety. NEEDS PASS 05/24/21   Gildardo Pounds, NP  naproxen (NAPROSYN) 500 MG tablet Take 1 tablet (500 mg total) by mouth 2 (two) times daily with a meal. 08/25/21   Triplett, Cari B, FNP  predniSONE (DELTASONE) 50 MG tablet Take 1 tablet daily with breakfast for 5 days. 09/10/21   Leath-Warren, Alda Lea, NP  propranolol (INDERAL) 10 MG tablet Take 1 tablet (10 mg total) by mouth 2 (two) times daily as needed (Palpitations.). 07/13/21   Petrucelli, Samantha R, PA-C  Vitamin D, Ergocalciferol, (DRISDOL) 1.25 MG (50000 UNIT) CAPS capsule Take 1 capsule (50,000 Units total) by mouth every 7 (seven) days. 05/25/21   Gildardo Pounds, NP    ROS: Neg HEENT Neg resp Neg cardiac Neg GI Neg psych Neg neuro  Objective:   Vitals:   09/14/21 1627  BP: 132/84  Pulse: (!) 109  SpO2: 93%  Weight: (!) 419 lb 12.8 oz (190.4 kg)  Height: 6\' 3"  (1.905 m)   Exam General appearance : Awake, alert, not in any distress. Speech pressured but Clear. Not toxic looking.  Tangential at times.  Anxious affect HEENT: Atraumatic and Normocephalic Neck: Supple, no JVD. No cervical lymphadenopathy.  Chest: Good air entry bilaterally, CTAB.  No rales/rhonchi/wheezing CVS: S1 S2  regular, no murmurs. Rate at 92 bpm on exam Extremities: B/L Lower Ext shows mild edema, both legs are warm to touch Neurology: Awake alert, and oriented X 3, CN II-XII intact, Non focal Skin: No Rash  Data Review No results found for: "HGBA1C"  Assessment & Plan   1. Pain in both testicles See last 2 ED notes.  No pain today - Ambulatory referral to Urology  2. Chronic bilateral low back pain without sciatica - Ambulatory referral to Orthopedic Surgery - cyclobenzaprine (FLEXERIL) 5 MG tablet; Take 1-2 tablets (5-10 mg total) by mouth at bedtime.  Dispense: 60 tablet; Refill: 2  3. Anxiety  and depression Counseled on self care, cutting back on sodas/sugars.  Drink 80-100 ounces water daily - sertraline (ZOLOFT) 50 MG tablet; Take 1 tablet (50 mg total) by mouth daily.  Dispense: 30 tablet; Refill: 3  4. Encounter for examination following treatment at hospital  5. Positive depression screening Counseled on self care, started Zoloft.  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  No acute safety issues  Spent 45 mins face to face    Return in about 6 weeks (around 10/26/2021) for PCP or me for med recheck.  The patient was given clear instructions to go to ER or return to medical center if symptoms don't improve, worsen or new problems develop. The patient verbalized understanding. The patient was told to call to get lab results if they haven't heard anything in the next week.      Georgian Co, PA-C [facility] [place], [place] [phone]   09/14/2021, 5:12 PM

## 2021-09-14 NOTE — Patient Instructions (Signed)
Drink 80-100 ounces water daily.    Eliminate sugary drinks

## 2021-09-28 ENCOUNTER — Telehealth: Payer: Self-pay | Admitting: Cardiovascular Disease

## 2021-09-28 ENCOUNTER — Ambulatory Visit
Admission: EM | Admit: 2021-09-28 | Discharge: 2021-09-28 | Disposition: A | Discharge status: Home / Self Care | Payer: 59

## 2021-09-28 ENCOUNTER — Encounter: Payer: Self-pay | Admitting: Emergency Medicine

## 2021-09-28 DIAGNOSIS — R002 Palpitations: Secondary | ICD-10-CM

## 2021-09-28 NOTE — ED Provider Notes (Signed)
[facility]   MRN: [id number] DOB: Dec 01, 1995  Subjective:   Steven Diaz is a 26 y.o. male presenting for recurrent palpitations.  Patient reports that these have gone on for several months.  Currently this was exacerbated by trying to work out to lose weight over the past 3 weeks.  Today has felt been very prominently.  He does have a cardiologist with [facility] but he is in the plan of switching providers and has not set up a follow-up appointment yet.  Had an echocardiogram done 03/14/2021 and was fairly unremarkable.  January 2023 patient had a chest CT scan for rule out of a pulmonary embolism and was negative.  He was supposed to have a cardiac chest CT but he did not follow through with this. No overt chest pain, shob. Has asthma, does not use his albuterol inhaler regularly.  Last use was a month ago.  Patient vapes nicotine multiple times daily.  He is also trying to practice a healthier diet.  No current facility-administered medications for this encounter.  Current Outpatient Medications:    albuterol (VENTOLIN HFA) 108 (90 Base) MCG/ACT inhaler, Inhale 2 puffs into the lungs every 6 (six) hours as needed for wheezing or shortness of breath. NEEDS PASS, Disp: 18 g, Rfl: 1   amLODipine (NORVASC) 5 MG tablet, Take 1 tablet (5 mg total) by mouth daily for 90 doses. (Patient not taking: Reported on 07/13/2021), Disp: 90 tablet, Rfl: 1   cyclobenzaprine (FLEXERIL) 5 MG tablet, Take 1-2 tablets (5-10 mg total) by mouth at bedtime., Disp: 60 tablet, Rfl: 2   hydrOXYzine (ATARAX) 10 MG tablet, Take 1 tablet (10 mg total) by mouth 3 (three) times daily as needed. NEEDS PASS (Patient taking differently: Take 10 mg by mouth 3 (three) times daily as needed for anxiety. NEEDS PASS), Disp: 90 tablet, Rfl: 1   naproxen (NAPROSYN) 500 MG tablet, Take 1 tablet (500 mg total) by mouth 2 (two) times daily with a meal., Disp: 30 tablet, Rfl: 0   predniSONE (DELTASONE) 50 MG  tablet, Take 1 tablet daily with breakfast for 5 days., Disp: 5 tablet, Rfl: 0   propranolol (INDERAL) 10 MG tablet, Take 1 tablet (10 mg total) by mouth 2 (two) times daily as needed (Palpitations.)., Disp: 10 tablet, Rfl: 0   sertraline (ZOLOFT) 50 MG tablet, Take 1 tablet (50 mg total) by mouth daily., Disp: 30 tablet, Rfl: 3   Vitamin D, Ergocalciferol, (DRISDOL) 1.25 MG (50000 UNIT) CAPS capsule, Take 1 capsule (50,000 Units total) by mouth every 7 (seven) days., Disp: 12 capsule, Rfl: 1   Allergies  Allergen Reactions   Perflutren Other (See Comments)    Pain that "radiated through whole body"   Risperidone Other (See Comments)    Gynecomastia.     Past Medical History:  Diagnosis Date   Arthritis    Asthma    Bipolar 1 disorder (HCC)    Hypertension    PTSD (post-traumatic stress disorder)      History reviewed. No pertinent surgical history.   Family History  Problem Relation Age of Onset   COPD Mother    Heart disease Mother    Cancer Mother    Heart disease Father    Diabetes Maternal Grandmother    Diabetes Maternal Grandfather    Diabetes Paternal Grandmother    Diabetes Paternal Grandfather     Social History   Tobacco Use   Smoking status: Former    Packs/day: 1.00  Years: 15.00    Total pack years: 15.00    Types: Cigarettes    Quit date: 02/12/2020    Years since quitting: 1.6   Smokeless tobacco: Never  Vaping Use   Vaping Use: Some days   Start date: 02/06/2020   Substances: Nicotine, Flavoring  Substance Use Topics   Alcohol use: Not Currently   Drug use: Never    ROS   Objective:   Vitals: BP 113/65   Pulse (!) 110   Temp 98.3 F (36.8 C)   Resp 20   SpO2 98%   Physical Exam Constitutional:      General: He is not in acute distress.    Appearance: Normal appearance. He is well-developed. He is obese. He is not ill-appearing, toxic-appearing or diaphoretic.  HENT:     Head: Normocephalic and atraumatic.     Right Ear:  External ear normal.     Left Ear: External ear normal.     Nose: Nose normal.     Mouth/Throat:     Mouth: Mucous membranes are moist.  Eyes:     General: No scleral icterus.       Right eye: No discharge.        Left eye: No discharge.     Extraocular Movements: Extraocular movements intact.  Cardiovascular:     Rate and Rhythm: Normal rate and regular rhythm.     Heart sounds: Normal heart sounds. No murmur heard.    No friction rub. No gallop.  Pulmonary:     Effort: Pulmonary effort is normal. No respiratory distress.     Breath sounds: Normal breath sounds. No stridor. No wheezing, rhonchi or rales.  Neurological:     Mental Status: He is alert and oriented to person, place, and time.  Psychiatric:        Mood and Affect: Mood normal.        Behavior: Behavior normal.        Thought Content: Thought content normal.    ED ECG REPORT   Date: 09/28/2021  EKG Time: 5:26 PM  Rate: 97  Rhythm: normal sinus rhythm,  unchanged from previous tracings  Axis: normal  Intervals:none  ST&T Change: Nonspecific T wave flattening in lead III  Narrative Interpretation: Sinus rhythm at 97 bpm with nonspecific T wave changes above, very comparable to previous EKG.     Assessment and Plan :   PDMP not reviewed this encounter.  1. Palpitations    Patient is hemodynamically stable, EKG does not show tachycardia.  No arrhythmias or acute findings found on EKG.  Emphasized need to follow-up with his new cardiologist at [facility].  For now we will hold off on medications which patient prefers.  Encouraged patient to continue with his efforts at weight loss and healthier diet, recommended he quit vaping. Counseled patient on potential for adverse effects with medications prescribed/recommended today, ER and return-to-clinic precautions discussed, patient verbalized understanding.    Steven Diaz, [place] 09/28/21 1727

## 2021-09-28 NOTE — ED Triage Notes (Signed)
Pt here with dizziness and palpitations x 3 weeks. Pt states that the issue has been occurring for awhile, but he has started to recently work out again and trying to lose weight and it seems amplified. Pt reports 4 palpitations an hour today.

## 2021-09-28 NOTE — Telephone Encounter (Signed)
Patient requesting to switch from Dr. Eden Emms to Dr. Izora Ribas.

## 2021-09-30 ENCOUNTER — Encounter (HOSPITAL_COMMUNITY): Payer: Self-pay

## 2021-09-30 ENCOUNTER — Emergency Department (HOSPITAL_COMMUNITY)
Admission: EM | Admit: 2021-09-30 | Discharge: 2021-09-30 | Discharge status: Against Medical Advice | Payer: 59 | Attending: Emergency Medicine | Admitting: Emergency Medicine

## 2021-09-30 ENCOUNTER — Emergency Department (HOSPITAL_COMMUNITY): Payer: 59

## 2021-09-30 DIAGNOSIS — R0789 Other chest pain: Secondary | ICD-10-CM | POA: Diagnosis not present

## 2021-09-30 DIAGNOSIS — Z5321 Procedure and treatment not carried out due to patient leaving prior to being seen by health care provider: Secondary | ICD-10-CM | POA: Diagnosis not present

## 2021-09-30 DIAGNOSIS — R002 Palpitations: Secondary | ICD-10-CM | POA: Diagnosis not present

## 2021-09-30 DIAGNOSIS — R072 Precordial pain: Secondary | ICD-10-CM | POA: Diagnosis not present

## 2021-09-30 LAB — BASIC METABOLIC PANEL
Anion gap: 10 (ref 5–15)
BUN: 9 mg/dL (ref 6–20)
CO2: 21 mmol/L — ABNORMAL LOW (ref 22–32)
Calcium: 9 mg/dL (ref 8.9–10.3)
Chloride: 107 mmol/L (ref 98–111)
Creatinine, Ser: 0.69 mg/dL (ref 0.61–1.24)
GFR, Estimated: >60 mL/min (ref >60)
Glucose, Bld: 100 mg/dL — ABNORMAL HIGH (ref 70–99)
Potassium: 4.1 mmol/L (ref 3.5–5.1)
Sodium: 138 mmol/L (ref 135–145)

## 2021-09-30 LAB — CBC
HCT: 46.6 % (ref 39.0–52.0)
Hemoglobin: 14.9 g/dL (ref 13.0–17.0)
MCH: 26.5 pg (ref 26.0–34.0)
MCHC: 32 g/dL (ref 30.0–36.0)
MCV: 82.9 fL (ref 80.0–100.0)
Platelets: 348 10*3/uL (ref 150–400)
RBC: 5.62 MIL/uL (ref 4.22–5.81)
RDW: 14.4 % (ref 11.5–15.5)
WBC: 7.8 10*3/uL (ref 4.0–10.5)
nRBC: 0 % (ref 0.0–0.2)

## 2021-09-30 LAB — TROPONIN I (HIGH SENSITIVITY): Troponin I (High Sensitivity): 3 ng/L (ref <18)

## 2021-09-30 NOTE — ED Provider Triage Note (Signed)
Emergency Medicine Provider Triage Evaluation Note  Steven Diaz , a 26 y.o. male  was evaluated in triage.  Pt complains of palpitations and chest pressure.  Intermittent, worse when he goes from sitting or laying down to standing.  Patient reports that he needs to be at work and will have to leave prior to completion of work-up.  Review of Systems  Positive: Intermittent chest discomfort and palpitations Negative: Shortness of breath  Physical Exam  BP (!) 141/82 (BP Location: Right Arm)   Pulse (!) 104   Temp 97.9 F (36.6 C) (Oral)   Resp 15   SpO2 95%  Gen:   Awake, no distress   Resp:  Normal effort  MSK:   Moves extremities without difficulty  Other:  RRR, lung sounds clear  Medical Decision Making  Medically screening exam initiated at 9:16 AM.  Appropriate orders placed.  Jailyn Langhorst was informed that the remainder of the evaluation will be completed by another provider, this initial triage assessment does not replace that evaluation, and the importance of remaining in the ED until their evaluation is complete.  Work-up is appropriate however patient reports that he is going to need to leave.  Has a follow-up with cardiology   Saddie Benders, PA-C 09/30/21 [phone]

## 2021-09-30 NOTE — ED Notes (Signed)
Pt told the PA he was leaving.

## 2021-09-30 NOTE — ED Triage Notes (Signed)
Pt presents with c/o chest pain that started today. Pt reports he does follow a cardiologist, waiting for an appointment with a new doctor at this time. Pt reports palpitations present for approx 3 weeks.

## 2021-10-04 ENCOUNTER — Ambulatory Visit: Payer: 59 | Admitting: Physical Medicine and Rehabilitation

## 2021-10-06 ENCOUNTER — Other Ambulatory Visit: Payer: Self-pay | Admitting: Physician Assistant

## 2021-10-06 DIAGNOSIS — F32A Depression, unspecified: Secondary | ICD-10-CM

## 2021-10-27 ENCOUNTER — Ambulatory Visit: Payer: 59 | Admitting: Nurse Practitioner

## 2021-12-20 ENCOUNTER — Ambulatory Visit: Payer: 59 | Attending: Nurse Practitioner | Admitting: Nurse Practitioner

## 2021-12-20 ENCOUNTER — Encounter: Payer: Self-pay | Admitting: Nurse Practitioner

## 2021-12-20 VITALS — BP 117/77 | HR 87 | Ht 75.0 in | Wt >= 6400 oz

## 2021-12-20 DIAGNOSIS — R002 Palpitations: Secondary | ICD-10-CM | POA: Diagnosis not present

## 2021-12-20 DIAGNOSIS — M545 Low back pain, unspecified: Secondary | ICD-10-CM | POA: Diagnosis not present

## 2021-12-20 DIAGNOSIS — G8929 Other chronic pain: Secondary | ICD-10-CM | POA: Diagnosis not present

## 2021-12-20 MED ORDER — PROPRANOLOL HCL 10 MG PO TABS: 10.0000 mg | ORAL_TABLET | Freq: Two times a day (BID) | ORAL | 1 refills | Status: DC | PRN

## 2021-12-20 MED ORDER — CYCLOBENZAPRINE HCL 5 MG PO TABS: 5.0000 mg | ORAL_TABLET | Freq: Every day | ORAL | 2 refills | Status: DC

## 2021-12-20 MED ORDER — IBUPROFEN 800 MG PO TABS: 800.0000 mg | ORAL_TABLET | Freq: Three times a day (TID) | ORAL | 1 refills | Status: DC | PRN

## 2021-12-20 NOTE — Progress Notes (Signed)
Assessment & Plan:  [name] was seen today for palpitations.  Diagnoses and all orders for this visit:  Palpitations with regular cardiac rhythm -     propranolol (INDERAL) 10 MG tablet; Take 1 tablet (10 mg total) by mouth 2 (two) times daily as needed (Palpitations.). FOLLOW UP WITH [facility]. LIKELY NEED ZIO PATCH  Chronic bilateral low back pain without sciatica -     DG Lumbar Spine Complete; Future -     ibuprofen (ADVIL) 800 MG tablet; Take 1 tablet (800 mg total) by mouth every 8 (eight) hours as needed. -     cyclobenzaprine (FLEXERIL) 5 MG tablet; Take 1-2 tablets (5-10 mg total) by mouth at bedtime. Discussed diet and exercise for person with BMI >51. Instructed: You must burn more calories than you eat. Losing 5 percent of your body weight should be considered a success. In the longer term, losing more than 15 percent of your body weight and staying at this weight is an extremely good result. However, keep in mind that even losing 5 percent of your body weight leads to important health benefits, so try not to get discouraged if you're not able to lose more than this.     Patient has been counseled on age-appropriate routine health concerns for screening and prevention. These are reviewed and up-to-date. Referrals have been placed accordingly. Immunizations are up-to-date or declined.    Subjective:   Chief Complaint  Patient presents with   Palpitations   HPI Steven Diaz 26 y.o. male presents to office today for follow up to palpitations  Palpitations: Patient complains of dizziness, palpitations, rapid heart beat, and shortness of breath.  The symptoms are of moderate and severe severity, occuring intermittently and lasting several  seconds  per episode. Cardiac risk factors include: obesity (BMI >= 30 kg/m2) and smoking/ tobacco exposure/VAPING. Feels palpitations started after receiving the COVID vaccine. Aggravating factors: none. Relieving factors:  spontaneous.   Had an echocardiogram done 03/14/2021 and was fairly unremarkable. January 2023 patient had a chest CT scan for rule out of a pulmonary embolism and was negative.  He was supposed to have a cardiac chest CT but per records was lost to follow through with this. Has asthma, does not require use of his albuterol inhaler regularly.  Patient vapes nicotine multiple times daily.  He is also trying to practice a healthier diet. Never picked up propranol.  He recently switched cardiologists at [facility]. Has not scheduled yet with new provider there but states he will call.   Diclofenac not helping. Muscle relaxants are helping him sleep and he only takes as needed. Naproxen. Meloxicam 15 mg .  Propranolol takes his breath away. Not drinking caffeine. But does drink fanta and sprite.  BP Readings from Last 3 Encounters:  12/20/21 117/77  09/30/21 (!) 141/82  09/28/21 113/65    Back Pain He reports chronic low back pain unrelated to any injury.  Symptoms have been present for several months and include pain in low back with numbness and tingling in legs and feet  Alleviating factors identifiable by patient are none. Exacerbating factors identifiable by patient are recumbency, sitting, standing, and walking. Treatments so far initiated by patient:  working on losing weight  Previous lower back problems: none. Previous workup: none. Previous treatments:  flexeril, naproxen, meloxicam, diclofenac .     Review of Systems  Constitutional:  Negative for fever, malaise/fatigue and weight loss.  HENT: Negative.  Negative for nosebleeds.   Eyes: Negative.  Negative for blurred vision, double vision and photophobia.  Respiratory: Negative.  Negative for cough and shortness of breath.   Cardiovascular:  Positive for palpitations. Negative for chest pain, orthopnea, claudication and leg swelling.  Gastrointestinal: Negative.  Negative for heartburn, nausea and vomiting.  Musculoskeletal:   Positive for back pain. Negative for myalgias.  Neurological: Negative.  Negative for dizziness, focal weakness, seizures and headaches.  Psychiatric/Behavioral: Negative.  Negative for suicidal ideas.     Past Medical History:  Diagnosis Date   Arthritis    Asthma    Bipolar 1 disorder (HCC)    Hypertension    PTSD (post-traumatic stress disorder)     History reviewed. No pertinent surgical history.  Family History  Problem Relation Age of Onset   COPD Mother    Heart disease Mother    Cancer Mother    Heart disease Father    Diabetes Maternal Grandmother    Diabetes Maternal Grandfather    Diabetes Paternal Grandmother    Diabetes Paternal Grandfather     Social History Reviewed with no changes to be made today.   Outpatient Medications Prior to Visit  Medication Sig Dispense Refill   albuterol (VENTOLIN HFA) 108 (90 Base) MCG/ACT inhaler Inhale 2 puffs into the lungs every 6 (six) hours as needed for wheezing or shortness of breath. NEEDS PASS 18 g 1   hydrOXYzine (ATARAX) 10 MG tablet Take 1 tablet (10 mg total) by mouth 3 (three) times daily as needed. NEEDS PASS (Patient taking differently: Take 10 mg by mouth 3 (three) times daily as needed for anxiety. NEEDS PASS) 90 tablet 1   Vitamin D, Ergocalciferol, (DRISDOL) 1.25 MG (50000 UNIT) CAPS capsule Take 1 capsule (50,000 Units total) by mouth every 7 (seven) days. (Patient not taking: Reported on 12/20/2021) 12 capsule 1   amLODipine (NORVASC) 5 MG tablet Take 1 tablet (5 mg total) by mouth daily for 90 doses. (Patient not taking: Reported on 07/13/2021) 90 tablet 1   cyclobenzaprine (FLEXERIL) 5 MG tablet Take 1-2 tablets (5-10 mg total) by mouth at bedtime. 60 tablet 2   naproxen (NAPROSYN) 500 MG tablet Take 1 tablet (500 mg total) by mouth 2 (two) times daily with a meal. (Patient not taking: Reported on 12/20/2021) 30 tablet 0   predniSONE (DELTASONE) 50 MG tablet Take 1 tablet daily with breakfast for 5 days. (Patient  not taking: Reported on 12/20/2021) 5 tablet 0   propranolol (INDERAL) 10 MG tablet Take 1 tablet (10 mg total) by mouth 2 (two) times daily as needed (Palpitations.). 10 tablet 0   sertraline (ZOLOFT) 50 MG tablet TAKE 1 TABLET BY MOUTH EVERY DAY (Patient not taking: Reported on 12/20/2021) 90 tablet 0   No facility-administered medications prior to visit.    Allergies  Allergen Reactions   Perflutren Other (See Comments)    Pain that "radiated through whole body"   Risperidone Other (See Comments)    Gynecomastia.        Objective:    BP 117/77   Pulse 87   Ht 6\' 3"  (1.905 m)   Wt (!) 414 lb 9.6 oz (188.1 kg)   SpO2 98%   BMI 51.82 kg/m  Wt Readings from Last 3 Encounters:  12/20/21 (!) 414 lb 9.6 oz (188.1 kg)  09/14/21 (!) 419 lb 12.8 oz (190.4 kg)  08/25/21 (!) 399 lb 14.6 oz (181.4 kg)    Physical Exam Vitals and nursing note reviewed.  Constitutional:      Appearance: He  is well-developed.  HENT:     Head: Normocephalic and atraumatic.  Cardiovascular:     Rate and Rhythm: Normal rate and regular rhythm.     Heart sounds: Normal heart sounds. No murmur heard.    No friction rub. No gallop.  Pulmonary:     Effort: Pulmonary effort is normal. No tachypnea or respiratory distress.     Breath sounds: Normal breath sounds. No decreased breath sounds, wheezing, rhonchi or rales.  Chest:     Chest wall: No tenderness.  Abdominal:     General: Bowel sounds are normal.     Palpations: Abdomen is soft.  Musculoskeletal:        General: Normal range of motion.     Cervical back: Normal range of motion.  Skin:    General: Skin is warm and dry.  Neurological:     Mental Status: He is alert and oriented to person, place, and time.     Coordination: Coordination normal.  Psychiatric:        Behavior: Behavior normal. Behavior is cooperative.        Thought Content: Thought content normal.        Judgment: Judgment normal.          Patient has been counseled  extensively about nutrition and exercise as well as the importance of adherence with medications and regular follow-up. The patient was given clear instructions to go to ER or return to medical center if symptoms don't improve, worsen or new problems develop. The patient verbalized understanding.   Follow-up: Return for for back pain in 2 months.   Claiborne Rigg, FNP-BC [facility] [place], [place] [phone]   12/20/2021, 9:50 AM

## 2021-12-23 ENCOUNTER — Encounter: Payer: Self-pay | Admitting: Nurse Practitioner

## 2021-12-24 ENCOUNTER — Other Ambulatory Visit: Payer: Self-pay

## 2021-12-24 ENCOUNTER — Encounter (HOSPITAL_COMMUNITY): Payer: Self-pay | Admitting: Emergency Medicine

## 2021-12-24 ENCOUNTER — Emergency Department (HOSPITAL_COMMUNITY)
Admission: EM | Admit: 2021-12-24 | Discharge: 2021-12-24 | Discharge status: Against Medical Advice | Payer: 59 | Attending: Emergency Medicine | Admitting: Emergency Medicine

## 2021-12-24 DIAGNOSIS — M791 Myalgia, unspecified site: Secondary | ICD-10-CM | POA: Diagnosis not present

## 2021-12-24 DIAGNOSIS — L299 Pruritus, unspecified: Secondary | ICD-10-CM | POA: Diagnosis not present

## 2021-12-24 DIAGNOSIS — Z5321 Procedure and treatment not carried out due to patient leaving prior to being seen by health care provider: Secondary | ICD-10-CM | POA: Insufficient documentation

## 2021-12-24 MED ORDER — DIPHENHYDRAMINE HCL 25 MG PO CAPS
50.0000 mg | ORAL_CAPSULE | Freq: Once | ORAL | Status: AC
  Administered 2021-12-24: 50 mg via ORAL
  Filled 2021-12-24: qty 2

## 2021-12-24 NOTE — ED Triage Notes (Signed)
Pt c/o being itchy and body aches since yesterday. Pt states that he started propanolol x 2 days ago. States he didn't take benadryl

## 2021-12-24 NOTE — ED Notes (Signed)
Patient left AMA, notified the MD

## 2021-12-29 ENCOUNTER — Other Ambulatory Visit: Payer: Self-pay | Admitting: Nurse Practitioner

## 2021-12-29 DIAGNOSIS — R002 Palpitations: Secondary | ICD-10-CM

## 2022-01-01 NOTE — Telephone Encounter (Signed)
Requested medication (s) are due for refill today: routing for review  Requested medication (s) are on the active medication list: yes  Last refill:  12/20/21  Future visit scheduled: yes  Notes to clinic:  Unable to refill per protocol, pharmacy request 90 day supply, routing for review.     Requested Prescriptions  Pending Prescriptions Disp Refills   propranolol (INDERAL) 10 MG tablet [Pharmacy Med Name: PROPRANOLOL 10 MG TABLET] 180 tablet 1    Sig: Take 1 tablet (10 mg total) by mouth 2 (two) times daily as needed (Palpitations.).     Cardiovascular:  Beta Blockers Failed - 12/29/2021 11:31 AM      Failed - Last BP in normal range    BP Readings from Last 1 Encounters:  12/24/21 (!) 137/115         Passed - Last Heart Rate in normal range    Pulse Readings from Last 1 Encounters:  12/24/21 95         Passed - Valid encounter within last 6 months    Recent Outpatient Visits           1 week ago Palpitations with regular cardiac rhythm   [facility] [place], Shea Stakes, NP   3 months ago Pain in both testicles   [facility] [place], Marzella Schlein, [place]   7 months ago Encounter for annual physical exam   [facility] [place], Shea Stakes, NP   9 months ago Encounter to establish care   [facility] [place], Shea Stakes, NP       Future Appointments             In 1 month Claiborne Rigg, NP [facility]

## 2022-01-25 ENCOUNTER — Telehealth: Payer: Self-pay | Admitting: Student

## 2022-01-25 DIAGNOSIS — G4733 Obstructive sleep apnea (adult) (pediatric): Secondary | ICD-10-CM

## 2022-01-25 NOTE — Telephone Encounter (Signed)
PT calling to see if CPAP info has been fax'd over yet

## 2022-01-25 NOTE — Telephone Encounter (Signed)
Called and spoke with patient. He stated that he was calling to check on the status of his cpap supplies order from Tuesday. He has been within his cpap machine for several days because he needs a new mask. With his cpap machine, he has not been sleeping well. He was upset that he hadn't received a call nor order placed.   I explained to him that no message had been taken 3 days ago. The last message in his chart was from July. He stated that he spoke with Louis Matte on Tuesday.   I apologized to him and advised that I would go ahead and send the order. He confirmed that he is using [facility] as his DME. Order has been placed and patient expressed appreciation.   Nothing further needed at time of call.

## 2022-01-26 ENCOUNTER — Telehealth: Payer: Self-pay | Admitting: Student

## 2022-01-26 NOTE — Telephone Encounter (Signed)
[employer] will be sending a fax this afternoon.  we need to watch for and complete it. PT has a leaking CPAP mask and called yesterday. [employer] says something was missing when we sent it yesterday. TY

## 2022-01-30 NOTE — Telephone Encounter (Signed)
Form received and placed in Dr Lind Guest sign folder

## 2022-02-01 NOTE — Telephone Encounter (Signed)
Called and spoke to Stickney from [employer] and the fax was received. Then called patient and let him know that the fax was received. He states he was on the way to pick up his supplies now from [employer]. Nothing further needed

## 2022-02-01 NOTE — Telephone Encounter (Signed)
Patient called to check on status of form- spoke with nurse who states form was faxed off yesterday 12/27.

## 2022-02-19 ENCOUNTER — Ambulatory Visit: Payer: 59 | Admitting: Nurse Practitioner

## 2022-03-15 ENCOUNTER — Other Ambulatory Visit: Payer: Self-pay

## 2022-03-15 ENCOUNTER — Encounter (HOSPITAL_COMMUNITY): Payer: Self-pay

## 2022-03-15 ENCOUNTER — Emergency Department (HOSPITAL_COMMUNITY)
Admission: EM | Admit: 2022-03-15 | Discharge: 2022-03-15 | Disposition: A | Discharge status: Home / Self Care | Payer: Medicaid Other | Attending: Emergency Medicine | Admitting: Emergency Medicine

## 2022-03-15 DIAGNOSIS — J45909 Unspecified asthma, uncomplicated: Secondary | ICD-10-CM | POA: Insufficient documentation

## 2022-03-15 DIAGNOSIS — I10 Essential (primary) hypertension: Secondary | ICD-10-CM | POA: Insufficient documentation

## 2022-03-15 DIAGNOSIS — M25562 Pain in left knee: Secondary | ICD-10-CM | POA: Insufficient documentation

## 2022-03-15 MED ORDER — NAPROXEN 500 MG PO TABS
500.0000 mg | ORAL_TABLET | Freq: Once | ORAL | Status: AC
  Administered 2022-03-15: 500 mg via ORAL
  Filled 2022-03-15: qty 1

## 2022-03-15 MED ORDER — NAPROXEN 500 MG PO TABS: 500.0000 mg | ORAL_TABLET | Freq: Two times a day (BID) | ORAL | 0 refills | Status: DC

## 2022-03-15 MED ORDER — PREDNISONE 10 MG PO TABS: 40.0000 mg | ORAL_TABLET | Freq: Every day | ORAL | 0 refills | Status: DC

## 2022-03-15 NOTE — ED Triage Notes (Signed)
Pt c/o left knee pain/swelling x1 week. Pt denies injury/trauma to left knee.

## 2022-03-15 NOTE — Discharge Instructions (Addendum)
You were evaluated due to left sided knee pain. This is likely inflammatory. Please follow up with orthopedics for further evaluation and management. I have prescribed an anti-inflammatory medication. Please take as directed. Return to the emergency department if you develop a sudden fever, the knee becomes significantly hot when compared to the other side, or you notice spreading redness.

## 2022-03-15 NOTE — ED Provider Notes (Signed)
[place] EMERGENCY DEPARTMENT AT [facility] Provider Note   CSN: [id number] Arrival date & time: 03/15/22  1445     History  Chief Complaint  Patient presents with   Left Knee Pain    Steven Diaz is a 27 y.o. male.  Patient presents to the emergency department complaining of left knee pain with some mild swelling which has been ongoing for the past week.  Patient denies any known injury or trauma to the area.  Patient states he takes no that his weight has been contributing to his knee pain and that he is currently seeing a weight loss center for weight loss.  Patient denies fevers, nausea, vomiting, systemic symptoms.  Patient works in a [employer] and is frequently bending throughout the day.  Past medical history significant for asthma, hypertension, arthritis, PTSD, bipolar 1 disorder  HPI     Home Medications Prior to Admission medications   Medication Sig Start Date End Date Taking? Authorizing Provider  naproxen (NAPROSYN) 500 MG tablet Take 1 tablet (500 mg total) by mouth 2 (two) times daily. 03/15/22  Yes Dorothyann Peng, PA-C  predniSONE (DELTASONE) 10 MG tablet Take 4 tablets (40 mg total) by mouth daily. 03/15/22  Yes Dorothyann Peng, PA-C  albuterol (VENTOLIN HFA) 108 (90 Base) MCG/ACT inhaler Inhale 2 puffs into the lungs every 6 (six) hours as needed for wheezing or shortness of breath. NEEDS PASS 05/24/21   Gildardo Pounds, NP  cyclobenzaprine (FLEXERIL) 5 MG tablet Take 1-2 tablets (5-10 mg total) by mouth at bedtime. 12/20/21   Gildardo Pounds, NP  hydrOXYzine (ATARAX) 10 MG tablet Take 1 tablet (10 mg total) by mouth 3 (three) times daily as needed. NEEDS PASS Patient taking differently: Take 10 mg by mouth 3 (three) times daily as needed for anxiety. NEEDS PASS 05/24/21   Gildardo Pounds, NP  ibuprofen (ADVIL) 800 MG tablet Take 1 tablet (800 mg total) by mouth every 8 (eight) hours as needed. 12/20/21   Gildardo Pounds, NP  propranolol  (INDERAL) 10 MG tablet TAKE 1 TABLET (10 MG TOTAL) BY MOUTH 2 (TWO) TIMES DAILY AS NEEDED (PALPITATIONS.). 01/02/22   Charlott Rakes, MD      Allergies    Perflutren and Risperidone    Review of Systems   Review of Systems  Musculoskeletal:  Positive for arthralgias.    Physical Exam Updated Vital Signs BP (!) 146/88 (BP Location: Left Arm)   Pulse 100   Temp 98 F (36.7 C) (Oral)   Resp 16   SpO2 100%  Physical Exam HENT:     Head: Normocephalic and atraumatic.  Eyes:     Conjunctiva/sclera: Conjunctivae normal.  Pulmonary:     Effort: Pulmonary effort is normal. No respiratory distress.  Musculoskeletal:        General: No signs of injury. Normal range of motion.     Cervical back: Normal range of motion.     Comments: Left knee with normal range of motion.  No erythema noted.  Very minimal swelling noted.  Left knee with mild tenderness to palpation.  Negative anterior drawer, negative McMurray testing  Skin:    General: Skin is dry.     Findings: No erythema.  Neurological:     Mental Status: He is alert.  Psychiatric:        Speech: Speech normal.        Behavior: Behavior normal.     ED Results / Procedures / Treatments  Labs (all labs ordered are listed, but only abnormal results are displayed) Labs Reviewed - No data to display  EKG None  Radiology No results found.  Procedures .Ortho Injury Treatment  Date/Time: 03/15/2022 3:25 PM  Performed by: Dorothyann Peng, PA-C Authorized by: Dorothyann Peng, PA-C   Consent:    Consent obtained:  Verbal   Consent given by:  Patient   Risks discussed:  Stiffness, restricted joint movement and vascular damage   Alternatives discussed:  No treatmentInjury location: knee Location details: left knee Injury type: soft tissue Pre-procedure neurovascular assessment: neurovascularly intact Immobilization: brace Splint Applied by: ED Tech Post-procedure neurovascular assessment: post-procedure  neurovascularly intact       Medications Ordered in ED Medications  naproxen (NAPROSYN) tablet 500 mg (500 mg Oral Given 03/15/22 1530)    ED Course/ Medical Decision Making/ A&P                             Medical Decision Making  Patient presents with a chief complaint of left-sided knee pain.  Differential diagnosis includes but is not limited to arthritis, gout, meniscal tear, ligamentous injury, dislocation, fracture, septic joint  The patient has underlying arthritis  I was able to see notes from family medicine with the patient is being evaluated and treated for his obesity  There is medication at this time for lab work.  I considered imaging but the patient has no known injury.  Very minimal swelling noted.  No significant effusion appreciated.  No warmth or erythema to suggest septic joint at this time  Very low clinical suspicion for septic joint.  Low clinical suspicion and history not supportive of fracture/dislocation.  History also does not suggest meniscal tear or ligamentous injury. No calf tenderness to suggest DVT.  This seems most likely due to underlying inflammation, possibly from arthritis or gout.  Plan to have patient follow-up with orthopedics for further evaluation and management.  Will discharge patient home with anti-inflammatory medication and prednisone.        Final Clinical Impression(s) / ED Diagnoses Final diagnoses:  Acute pain of left knee    Rx / DC Orders ED Discharge Orders          Ordered    predniSONE (DELTASONE) 10 MG tablet  Daily        03/15/22 1530    naproxen (NAPROSYN) 500 MG tablet  2 times daily        03/15/22 1530              Ronny Bacon 03/15/22 1530    Blanchie Dessert, MD 03/15/22 2253

## 2022-04-06 ENCOUNTER — Ambulatory Visit
Admission: EM | Admit: 2022-04-06 | Discharge: 2022-04-06 | Disposition: A | Discharge status: Home / Self Care | Payer: 59 | Attending: Urgent Care | Admitting: Urgent Care

## 2022-04-06 DIAGNOSIS — M542 Cervicalgia: Secondary | ICD-10-CM | POA: Diagnosis not present

## 2022-04-06 DIAGNOSIS — M5441 Lumbago with sciatica, right side: Secondary | ICD-10-CM | POA: Diagnosis not present

## 2022-04-06 MED ORDER — TIZANIDINE HCL 4 MG PO TABS: 4.0000 mg | ORAL_TABLET | Freq: Every day | ORAL | 0 refills | Status: DC

## 2022-04-06 MED ORDER — KETOROLAC TROMETHAMINE 60 MG/2ML IM SOLN
60.0000 mg | Freq: Once | INTRAMUSCULAR | Status: AC
  Administered 2022-04-06: 60 mg via INTRAMUSCULAR

## 2022-04-06 MED ORDER — PREDNISONE 20 MG PO TABS: ORAL_TABLET | ORAL | 0 refills | Status: DC

## 2022-04-06 NOTE — Discharge Instructions (Addendum)
For diabetes or elevated blood sugar, please make sure you are limiting and avoiding starchy, carbohydrate foods like pasta, breads, sweet breads, pastry, rice, potatoes, desserts. These foods can elevate your blood sugar. Also, limit and avoid drinks that contain a lot of sugar such as sodas, sweet teas, fruit juices.  Drinking plain water will be much more helpful, try 80 ounces of water daily.  It is okay to flavor your water naturally by cutting cucumber, lemon, mint or lime, placing it in a picture with water and drinking it over a period of 24-48 hours as long as it remains refrigerated.  For elevated blood pressure, make sure you are monitoring salt in your diet.  Do not eat restaurant foods and limit processed foods at home. I highly recommend you prepare and cook your own foods at home.  Processed foods include things like frozen meals, pre-seasoned meats and dinners, deli meats, canned foods as these foods contain a high amount of sodium/salt.  Make sure you are paying attention to sodium labels on foods you buy at the grocery store. Buy your spices separately such as garlic powder, onion powder, cumin, cayenne, parsley flakes so that you can avoid seasonings that contain salt. However, salt-free seasonings are available and can be used, an example is Mrs. Dash and includes a lot of different mixtures that do not contain salt.  Lastly, when cooking using oils that are healthier for you is important. This includes olive oil, avocado oil, canola oil. We have discussed a lot of foods to avoid but below is a list of foods that can be very healthy to use in your diet whether it is for diabetes, cholesterol, high blood pressure, or in general healthy eating.  Salads - kale, spinach, cabbage, spring mix, arugula Fruits - avocadoes, berries (blueberries, raspberries, blackberries), apples, oranges, pomegranate, grapefruit, kiwi Vegetables - asparagus, cauliflower, broccoli, green beans, brussel sprouts,  bell peppers, beets; stay away from or limit starchy vegetables like potatoes, carrots, peas Other general foods - kidney beans, egg whites, almonds, walnuts, sunflower seeds, pumpkin seeds, fat free yogurt, almond milk, flax seeds, quinoa, oats  Meat - It is better to eat lean meats and limit your red meat including pork to once a week.  Wild caught fish, chicken breast are good options as they tend to be leaner sources of good protein. Still be mindful of the sodium labels for the meats you buy.  DO NOT EAT ANY FOODS ON THIS LIST THAT YOU ARE ALLERGIC TO. For more specific needs, I highly recommend consulting a dietician or nutritionist but this can definitely be a good starting point.

## 2022-04-06 NOTE — ED Provider Notes (Signed)
[name]  Note:  This document was prepared using Systems analyst and may include unintentional dictation errors.  MRN: [id number] DOB: [birth year]  Subjective:   Steven Diaz is a 27 y.o. male presenting for 4 day history of recurrent neck pain, low back pain, is starting to feel pins-and-needles in his hands, radiation of his low back pain into his legs.  Patient has been working out consistently for the past 3 weeks.  Has been doing a lot of walking both around his block but also on the treadmill.  Has been trying to work with a nutritionist a healthier diet for weight loss.  Feels like it is not working as well for him. No fall, trauma, saddle paresthesia, changes to bowel or urinary habits.  He is still undergoing workup with cardiology as planned before.  No current facility-administered medications for this encounter.  Current Outpatient Medications:    albuterol (VENTOLIN HFA) 108 (90 Base) MCG/ACT inhaler, Inhale 2 puffs into the lungs every 6 (six) hours as needed for wheezing or shortness of breath. NEEDS PASS, Disp: 18 g, Rfl: 1   cyclobenzaprine (FLEXERIL) 5 MG tablet, Take 1-2 tablets (5-10 mg total) by mouth at bedtime., Disp: 60 tablet, Rfl: 2   hydrOXYzine (ATARAX) 10 MG tablet, Take 1 tablet (10 mg total) by mouth 3 (three) times daily as needed. NEEDS PASS (Patient taking differently: Take 10 mg by mouth 3 (three) times daily as needed for anxiety. NEEDS PASS), Disp: 90 tablet, Rfl: 1   ibuprofen (ADVIL) 800 MG tablet, Take 1 tablet (800 mg total) by mouth every 8 (eight) hours as needed., Disp: 60 tablet, Rfl: 1   naproxen (NAPROSYN) 500 MG tablet, Take 1 tablet (500 mg total) by mouth 2 (two) times daily., Disp: 30 tablet, Rfl: 0   predniSONE (DELTASONE) 10 MG tablet, Take 4 tablets (40 mg total) by mouth daily., Disp: 15 tablet, Rfl: 0   propranolol (INDERAL) 10 MG tablet, TAKE 1 TABLET (10 MG TOTAL) BY MOUTH 2 (TWO) TIMES  DAILY AS NEEDED (PALPITATIONS.)., Disp: 180 tablet, Rfl: 0   Allergies  Allergen Reactions   Perflutren Other (See Comments)    Pain that "radiated through whole body"   Risperidone Other (See Comments)    Gynecomastia.     Past Medical History:  Diagnosis Date   Arthritis    Asthma    Bipolar 1 disorder ([place])    Hypertension    PTSD (post-traumatic stress disorder)      No past surgical history on file.  Family History  Problem Relation Age of Onset   COPD Mother    Heart disease Mother    Cancer Mother    Heart disease Father    Diabetes Maternal Grandmother    Diabetes Maternal Grandfather    Diabetes Paternal Grandmother    Diabetes Paternal Grandfather     Social History   Tobacco Use   Smoking status: Former    Packs/day: 1.00    Years: 15.00    Total pack years: 15.00    Types: Cigarettes    Quit date: 02/12/2020    Years since quitting: 2.1   Smokeless tobacco: Never  Vaping Use   Vaping Use: Every day   Start date: 02/06/2020   Substances: Nicotine, Flavoring  Substance Use Topics   Alcohol use: Not Currently   Drug use: Never    ROS   Objective:   Vitals: BP 129/87   Pulse 89   Temp  98.3 F (36.8 C) (Oral)   Resp 16   SpO2 97%   Physical Exam Constitutional:      General: He is not in acute distress.    Appearance: Normal appearance. He is well-developed and normal weight. He is not ill-appearing, toxic-appearing or diaphoretic.  HENT:     Head: Normocephalic and atraumatic.     Right Ear: External ear normal.     Left Ear: External ear normal.     Nose: Nose normal.     Mouth/Throat:     Mouth: Mucous membranes are moist.  Eyes:     General: No scleral icterus.       Right eye: No discharge.        Left eye: No discharge.     Extraocular Movements: Extraocular movements intact.  Cardiovascular:     Rate and Rhythm: Normal rate and regular rhythm.     Heart sounds: Normal heart sounds. No murmur heard.    No friction rub. No  gallop.  Pulmonary:     Effort: Pulmonary effort is normal. No respiratory distress.     Breath sounds: Normal breath sounds. No stridor. No wheezing, rhonchi or rales.  Musculoskeletal:     Cervical back: Normal range of motion. No swelling, edema, deformity, erythema, signs of trauma, lacerations, rigidity, spasms, torticollis, tenderness, bony tenderness or crepitus. Pain with movement present. Normal range of motion.     Lumbar back: Spasms and tenderness present. No swelling, edema, deformity, signs of trauma, lacerations or bony tenderness. Normal range of motion. Positive right straight leg raise test. Negative left straight leg raise test. No scoliosis.  Neurological:     Mental Status: He is alert and oriented to person, place, and time.     Cranial Nerves: No cranial nerve deficit.     Sensory: No sensory deficit.     Motor: No weakness.     Coordination: Coordination normal.     Gait: Gait normal.     Deep Tendon Reflexes: Reflexes normal.  Psychiatric:        Mood and Affect: Mood normal.        Behavior: Behavior normal.        Thought Content: Thought content normal.        Judgment: Judgment normal.    IM Toradol in clinic 60 mg.  Assessment and Plan :   PDMP not reviewed this encounter.  1. Neck pain   2. Acute right-sided low back pain with right-sided sciatica     Recommended a short prednisone course starting tomorrow 40 mg to address his suspected lumbar radiculopathy, neuropathic symptoms likely related to overuse and inflammation.  Offered patient imaging but he declined and I am in agreement given lack of trauma, physical exam findings supporting its utility.  Recommended he maintain follow-up with his PCP and also cardiologist. Discussed dietary modifications for weight loss. Counseled patient on potential for adverse effects with medications prescribed/recommended today, ER and return-to-clinic precautions discussed, patient verbalized understanding.     Jaynee Eagles, [place] 04/06/22 [id number]

## 2022-04-06 NOTE — ED Triage Notes (Signed)
Pt states chronic lower back pain states for the past 4 days the pain is back and he feels pins and needles all over. States he was on diclofenac and flexeril with no relief.

## 2022-05-18 ENCOUNTER — Encounter (HOSPITAL_COMMUNITY): Payer: Self-pay

## 2022-05-18 ENCOUNTER — Emergency Department (HOSPITAL_COMMUNITY)
Admission: EM | Admit: 2022-05-18 | Discharge: 2022-05-18 | Disposition: A | Discharge status: Home / Self Care | Payer: Medicaid Other | Attending: Emergency Medicine | Admitting: Emergency Medicine

## 2022-05-18 ENCOUNTER — Other Ambulatory Visit: Payer: Self-pay

## 2022-05-18 ENCOUNTER — Emergency Department (HOSPITAL_COMMUNITY): Payer: Medicaid Other

## 2022-05-18 DIAGNOSIS — R000 Tachycardia, unspecified: Secondary | ICD-10-CM | POA: Insufficient documentation

## 2022-05-18 DIAGNOSIS — R21 Rash and other nonspecific skin eruption: Secondary | ICD-10-CM | POA: Insufficient documentation

## 2022-05-18 DIAGNOSIS — R002 Palpitations: Secondary | ICD-10-CM | POA: Insufficient documentation

## 2022-05-18 LAB — CBC
HCT: 47.2 % (ref 39.0–52.0)
Hemoglobin: 15 g/dL (ref 13.0–17.0)
MCH: 26 pg (ref 26.0–34.0)
MCHC: 31.8 g/dL (ref 30.0–36.0)
MCV: 81.7 fL (ref 80.0–100.0)
Platelets: 458 10*3/uL — ABNORMAL HIGH (ref 150–400)
RBC: 5.78 MIL/uL (ref 4.22–5.81)
RDW: 13.8 % (ref 11.5–15.5)
WBC: 13.9 10*3/uL — ABNORMAL HIGH (ref 4.0–10.5)
nRBC: 0 % (ref 0.0–0.2)

## 2022-05-18 LAB — BASIC METABOLIC PANEL
Anion gap: 10 (ref 5–15)
BUN: 11 mg/dL (ref 6–20)
CO2: 21 mmol/L — ABNORMAL LOW (ref 22–32)
Calcium: 9.4 mg/dL (ref 8.9–10.3)
Chloride: 104 mmol/L (ref 98–111)
Creatinine, Ser: 0.81 mg/dL (ref 0.61–1.24)
GFR, Estimated: >60 mL/min (ref >60)
Glucose, Bld: 240 mg/dL — ABNORMAL HIGH (ref 70–99)
Potassium: 4.2 mmol/L (ref 3.5–5.1)
Sodium: 135 mmol/L (ref 135–145)

## 2022-05-18 LAB — D-DIMER, QUANTITATIVE: D-Dimer, Quant: <0.27 ug/mL-FEU (ref 0.00–0.50)

## 2022-05-18 MED ORDER — DIPHENHYDRAMINE HCL 50 MG/ML IJ SOLN
25.0000 mg | Freq: Once | INTRAMUSCULAR | Status: AC
  Administered 2022-05-18: 25 mg via INTRAVENOUS
  Filled 2022-05-18: qty 1

## 2022-05-18 MED ORDER — CETIRIZINE HCL 10 MG PO TABS: 10.0000 mg | ORAL_TABLET | Freq: Two times a day (BID) | ORAL | 0 refills | Status: DC

## 2022-05-18 MED ORDER — FAMOTIDINE IN NACL 20-0.9 MG/50ML-% IV SOLN
20.0000 mg | Freq: Once | INTRAVENOUS | Status: AC
  Administered 2022-05-18: 20 mg via INTRAVENOUS
  Filled 2022-05-18: qty 50

## 2022-05-18 MED ORDER — SODIUM CHLORIDE 0.9 % IV BOLUS
1000.0000 mL | Freq: Once | INTRAVENOUS | Status: AC
  Administered 2022-05-18: 1000 mL via INTRAVENOUS

## 2022-05-18 MED ORDER — KETOROLAC TROMETHAMINE 15 MG/ML IJ SOLN
15.0000 mg | Freq: Once | INTRAMUSCULAR | Status: AC
  Administered 2022-05-18: 15 mg via INTRAVENOUS
  Filled 2022-05-18: qty 1

## 2022-05-18 NOTE — Discharge Instructions (Addendum)
You are seen today for palpitations and a rash. Work ruled out a blood clot, your blood sugar was somewhat elevated at 240.  You need to have this rechecked with your primary care doctor.  It may be elevated because of the steroids but needs to be rechecked to make sure you do not have diabetes.  We are prescribing Zyrtec to help with your itching and rash.  Given the palpitations and high blood sugar we would not give you any further steroids because these can both be caused by steroids.  Come back to the ER for new or worsening symptoms.

## 2022-05-18 NOTE — ED Provider Notes (Signed)
[place] EMERGENCY DEPARTMENT AT [facility] Provider Note   CSN: [id number] Arrival date & time: 05/18/22  0144     History  No chief complaint on file.   Steven Diaz is a 27 y.o. male.  He reports recent diagnosis of dysautonomia.  The serum today complaining of rash to arms and face, itching all over and palpitations.   He woke up this morning he had a bumpy raised itchy rash to his face and itching to his arms and legs bilaterally.  Denies any new soaps lotions detergents or medications.  Reports he does have sensitive skin.  He has never had allergic reaction like this in the past he reports.  He decided to go to urgent care and was given a steroid shot nausea medication he reports.  He states he was feeling initially better and then shortly prior to arrival he started having palpitations.  He states he is just having palpitations with his dysautonomia but this feels different because he is having a sensation that somebody is "grabbing my heart".  He denies any shortness of breath, no leg swelling, no swelling of the lips throat or tongue.  No dizziness or syncope.  He reports the itching is also returned. HPI     Home Medications Prior to Admission medications   Medication Sig Start Date End Date Taking? Authorizing Provider  cetirizine (ZYRTEC ALLERGY) 10 MG tablet Take 1 tablet (10 mg total) by mouth 2 (two) times daily. 05/18/22  Yes Basil Buffin A, PA-C  ibuprofen (ADVIL) 200 MG tablet Take 200 mg by mouth every 6 (six) hours as needed for moderate pain.   Yes [provider]  albuterol (VENTOLIN HFA) 108 (90 Base) MCG/ACT inhaler Inhale 2 puffs into the lungs every 6 (six) hours as needed for wheezing or shortness of breath. NEEDS PASS Patient not taking: Reported on 05/18/2022 05/24/21   Claiborne Rigg, NP  cyclobenzaprine (FLEXERIL) 5 MG tablet Take 1-2 tablets (5-10 mg total) by mouth at bedtime. Patient not taking: Reported on 05/18/2022 12/20/21    Claiborne Rigg, NP  hydrOXYzine (ATARAX) 10 MG tablet Take 1 tablet (10 mg total) by mouth 3 (three) times daily as needed. NEEDS PASS Patient not taking: Reported on 05/18/2022 05/24/21   Claiborne Rigg, NP  ibuprofen (ADVIL) 800 MG tablet Take 1 tablet (800 mg total) by mouth every 8 (eight) hours as needed. Patient not taking: Reported on 05/18/2022 12/20/21   Claiborne Rigg, NP  naproxen (NAPROSYN) 500 MG tablet Take 1 tablet (500 mg total) by mouth 2 (two) times daily. Patient not taking: Reported on 05/18/2022 03/15/22   Darrick Grinder, PA-C  predniSONE (DELTASONE) 20 MG tablet Take 2 tablets daily with breakfast. Patient not taking: Reported on 05/18/2022 04/06/22   [name], PA-C  propranolol (INDERAL) 10 MG tablet TAKE 1 TABLET (10 MG TOTAL) BY MOUTH 2 (TWO) TIMES DAILY AS NEEDED (PALPITATIONS.). Patient not taking: Reported on 05/18/2022 01/02/22   Hoy Register, MD  tiZANidine (ZANAFLEX) 4 MG tablet Take 1 tablet (4 mg total) by mouth at bedtime. Patient not taking: Reported on 05/18/2022 04/06/22   [name], PA-C      Allergies    Perflutren and Risperidone    Review of Systems   Review of Systems  Physical Exam Updated Vital Signs BP (!) 140/80   Pulse (!) 105   Temp 98.5 F (36.9 C) (Oral)   Resp (!) 25   Ht  (1.905 m)  Wt (!) 190.1 kg   SpO2 96%   BMI 52.37 kg/m  Physical Exam Vitals and nursing note reviewed.  Constitutional:      General: He is not in acute distress.    Appearance: He is well-developed.  HENT:     Head: Normocephalic and atraumatic.     Mouth/Throat:     Mouth: Mucous membranes are moist.  Eyes:     Conjunctiva/sclera: Conjunctivae normal.  Cardiovascular:     Rate and Rhythm: Regular rhythm. Tachycardia present.     Heart sounds: No murmur heard. Pulmonary:     Effort: Pulmonary effort is normal. No respiratory distress.     Breath sounds: Normal breath sounds.  Abdominal:     Palpations: Abdomen is soft.      Tenderness: There is no abdominal tenderness.  Musculoskeletal:        General: No swelling.     Cervical back: Neck supple.  Skin:    General: Skin is warm and dry.     Capillary Refill: Capillary refill takes less than 2 seconds.  Neurological:     General: No focal deficit present.     Mental Status: He is alert and oriented to person, place, and time.     Gait: Gait normal.  Psychiatric:        Mood and Affect: Mood normal.     ED Results / Procedures / Treatments   Labs (all labs ordered are listed, but only abnormal results are displayed) Labs Reviewed  BASIC METABOLIC PANEL - Abnormal; Notable for the following components:      Result Value   CO2 21 (*)    Glucose, Bld 240 (*)    All other components within normal limits  CBC - Abnormal; Notable for the following components:   WBC 13.9 (*)    Platelets 458 (*)    All other components within normal limits  D-DIMER, QUANTITATIVE    EKG EKG Interpretation  Date/Time:  Friday May 18 2022 01:53:16 EDT Ventricular Rate:  120 PR Interval:  177 QRS Duration: 102 QT Interval:  303 QTC Calculation: 429 R Axis:   68 Text Interpretation: Sinus tachycardia Low voltage, precordial leads Baseline wander in lead(s) V5 V6 Confirmed by Ross Marcus ([id number]) on 05/18/2022 2:56:54 AM  Radiology DG Chest Port 1 View  Result Date: 05/18/2022 CLINICAL DATA:  Cardiac palpitations EXAM: PORTABLE CHEST 1 VIEW COMPARISON:  09/30/2021 FINDINGS: The heart size and mediastinal contours are within normal limits. Both lungs are clear. The visualized skeletal structures are unremarkable. IMPRESSION: No active disease. Electronically Signed   By: Alcide Clever M.D.   On: 05/18/2022 02:47    Procedures Procedures    Medications Ordered in ED Medications  sodium chloride 0.9 % bolus 1,000 mL (0 mLs Intravenous Stopped 05/18/22 0508)  diphenhydrAMINE (BENADRYL) injection 25 mg (25 mg Intravenous Given 05/18/22 0303)  famotidine (PEPCID)  IVPB 20 mg premix (0 mg Intravenous Stopped 05/18/22 0433)  ketorolac (TORADOL) 15 MG/ML injection 15 mg (15 mg Intravenous Given 05/18/22 0429)    ED Course/ Medical Decision Making/ A&P                             Medical Decision Making This patient presents to the ED for concern of rash, itching and palpitations, this involves an extensive number of treatment options, and is a complaint that carries with it a high risk of complications and morbidity.  The differential diagnosis  includes allergic reaction, contact dermatitis, atopic dermatitis, anxiety, adverse reaction to steroids, other   Co morbidities that complicate the patient evaluation  Morbid obesity   Additional history obtained:  Additional history obtained from EMR External records from outside source obtained and reviewed including viewed urgent care note from today, patient received 125 mg of Solu-Medrol, 25 mg of hydroxyzine and 4 mg of Zofran   Lab Tests:  I Ordered, and personally interpreted labs.  The pertinent results include: D-dimer is negative, BMP is significant for blood glucose of 240, CO2 slightly decreased at 21 CBC shows mild leukocytosis, mild thrombocytosis  Problem List / ED Course / Critical interventions / Medication management  Patient presents for itchy rash to his face and arms and legs and palpitations.  He was noted to have a papular rash to his face and bilateral lower legs.  Was treated symptomatically with Benadryl, Pepcid with good relief of his symptoms and decrease in his rash.  He also was having palpitations and was very tachycardic upon arrival.  The palpitations with some discomfort in his chest with these did do a D-dimer rule out PE and this was negative, he is having no ischemic changes on ECG, heart rate improved significantly with IV fluids and symptomatic management.  Discussed with patient that steroids can sometimes cause adverse reactions so we will not do any further steroids  but will treat with antihistamines for this likely allergic reaction.  Advised on close outpatient follow-up.  He has an appointment coming up closely with his primary care doctor.  He had no highness, hypotension, nausea or vomiting.  His reaction is not consistent with anaphylaxis.  He has no angioedema.  Chest x-ray viewed and independently interpreted by me, no pulmonary edema or infiltrate, pneumothorax, radiology read in agreement  Also discussed with patient that blood sugar was elevated and white blood count is low with these can both be reactions from the steroid injection as well but he is to follow closely with his primary care doctor  I have reviewed the patients home medicines and have made adjustments as needed  Amount and/or Complexity of Data Reviewed Labs: ordered. Radiology: ordered.  Risk OTC drugs. Prescription drug management.           Final Clinical Impression(s) / ED Diagnoses Final diagnoses:  Palpitations  Rash    Rx / DC Orders ED Discharge Orders          Ordered    cetirizine (ZYRTEC ALLERGY) 10 MG tablet  2 times daily        05/18/22 0427              Ma Rings, PA-C 05/18/22 0604    Shon Baton, MD 05/26/22 [phone]

## 2022-05-18 NOTE — ED Triage Notes (Signed)
Pt arrives POV for allergic rxn. Pt. Was seen at the urgent care earlier for the same and given a steriod shot. Pt. States that he feels like his rxn is coming back. Pt. Has a a rash to the face that he states is itchy. Pt. Also c/o chest pain, itchy throat, leg pain.

## 2022-06-06 ENCOUNTER — Emergency Department (HOSPITAL_COMMUNITY): Payer: Medicaid Other

## 2022-06-06 ENCOUNTER — Emergency Department (HOSPITAL_COMMUNITY)
Admission: EM | Admit: 2022-06-06 | Discharge: 2022-06-06 | Disposition: A | Discharge status: Home / Self Care | Payer: Medicaid Other | Attending: Emergency Medicine | Admitting: Emergency Medicine

## 2022-06-06 ENCOUNTER — Other Ambulatory Visit: Payer: Self-pay

## 2022-06-06 ENCOUNTER — Encounter (HOSPITAL_COMMUNITY): Payer: Self-pay

## 2022-06-06 DIAGNOSIS — G8929 Other chronic pain: Secondary | ICD-10-CM | POA: Diagnosis not present

## 2022-06-06 DIAGNOSIS — R0789 Other chest pain: Secondary | ICD-10-CM | POA: Insufficient documentation

## 2022-06-06 DIAGNOSIS — M545 Low back pain, unspecified: Secondary | ICD-10-CM | POA: Insufficient documentation

## 2022-06-06 LAB — MAGNESIUM: Magnesium: 2.2 mg/dL (ref 1.7–2.4)

## 2022-06-06 LAB — BASIC METABOLIC PANEL
Anion gap: 9 (ref 5–15)
BUN: 14 mg/dL (ref 6–20)
CO2: 23 mmol/L (ref 22–32)
Calcium: 9.1 mg/dL (ref 8.9–10.3)
Chloride: 106 mmol/L (ref 98–111)
Creatinine, Ser: 0.71 mg/dL (ref 0.61–1.24)
GFR, Estimated: >60 mL/min (ref >60)
Glucose, Bld: 96 mg/dL (ref 70–99)
Potassium: 3.9 mmol/L (ref 3.5–5.1)
Sodium: 138 mmol/L (ref 135–145)

## 2022-06-06 LAB — CBC WITH DIFFERENTIAL/PLATELET
Abs Immature Granulocytes: 0.01 10*3/uL (ref 0.00–0.07)
Basophils Absolute: 0 10*3/uL (ref 0.0–0.1)
Basophils Relative: 0 %
Eosinophils Absolute: 0.2 10*3/uL (ref 0.0–0.5)
Eosinophils Relative: 2 %
HCT: 44.3 % (ref 39.0–52.0)
Hemoglobin: 14.1 g/dL (ref 13.0–17.0)
Immature Granulocytes: 0 %
Lymphocytes Relative: 35 %
Lymphs Abs: 3.1 10*3/uL (ref 0.7–4.0)
MCH: 26.2 pg (ref 26.0–34.0)
MCHC: 31.8 g/dL (ref 30.0–36.0)
MCV: 82.2 fL (ref 80.0–100.0)
Monocytes Absolute: 0.6 10*3/uL (ref 0.1–1.0)
Monocytes Relative: 7 %
Neutro Abs: 5 10*3/uL (ref 1.7–7.7)
Neutrophils Relative %: 56 %
Platelets: 380 10*3/uL (ref 150–400)
RBC: 5.39 MIL/uL (ref 4.22–5.81)
RDW: 14.1 % (ref 11.5–15.5)
WBC: 9 10*3/uL (ref 4.0–10.5)
nRBC: 0 % (ref 0.0–0.2)

## 2022-06-06 LAB — TROPONIN I (HIGH SENSITIVITY)
Troponin I (High Sensitivity): 3 ng/L (ref <18)
Troponin I (High Sensitivity): 3 ng/L (ref <18)

## 2022-06-06 MED ORDER — MORPHINE SULFATE (PF) 4 MG/ML IV SOLN
4.0000 mg | Freq: Once | INTRAVENOUS | Status: AC
  Administered 2022-06-06: 4 mg via INTRAVENOUS
  Filled 2022-06-06: qty 1

## 2022-06-06 MED ORDER — METHOCARBAMOL 500 MG PO TABS
500.0000 mg | ORAL_TABLET | Freq: Once | ORAL | Status: AC
  Administered 2022-06-06: 500 mg via ORAL
  Filled 2022-06-06: qty 1

## 2022-06-06 MED ORDER — LACTATED RINGERS IV BOLUS
1000.0000 mL | Freq: Once | INTRAVENOUS | Status: AC
  Administered 2022-06-06: 1000 mL via INTRAVENOUS

## 2022-06-06 MED ORDER — ONDANSETRON HCL 4 MG/2ML IJ SOLN
4.0000 mg | Freq: Once | INTRAMUSCULAR | Status: AC
  Administered 2022-06-06: 4 mg via INTRAVENOUS
  Filled 2022-06-06: qty 2

## 2022-06-06 NOTE — ED Provider Notes (Signed)
[place] EMERGENCY DEPARTMENT AT [facility] Provider Note   CSN: [id number] Arrival date & time: 06/06/22  1432     History  Chief Complaint  Patient presents with   Back Pain    Steven Diaz is a 27 y.o. male.  27 year old past medical history significant for dysautonomia, chronic low back pain presents today for evaluation of low back pain which has been ongoing for about a week, and chest pain which started this morning.  Denies any other symptoms.  No lightheadedness no nausea, no diaphoresis, no radiation of the chest pain.  Pain is in the lower back.  He has not taken anything over-the-counter.  He states he was unable to go to work today.  He said he wanted to have evaluation ensure nothing significant was going on.  Low back pain does not radiate anywhere.  He describes it as feeling stiff.  Denies any saddle anesthesia, loss of bowel or bladder control, fever, history of malignancy, trauma to his back.  The history is provided by the patient. No language interpreter was used.       Home Medications Prior to Admission medications   Medication Sig Start Date End Date Taking? Authorizing Provider  albuterol (VENTOLIN HFA) 108 (90 Base) MCG/ACT inhaler Inhale 2 puffs into the lungs every 6 (six) hours as needed for wheezing or shortness of breath. NEEDS PASS Patient not taking: Reported on 05/18/2022 05/24/21   Claiborne Rigg, NP  cetirizine (ZYRTEC ALLERGY) 10 MG tablet Take 1 tablet (10 mg total) by mouth 2 (two) times daily. 05/18/22   Carmel Sacramento A, PA-C  cyclobenzaprine (FLEXERIL) 5 MG tablet Take 1-2 tablets (5-10 mg total) by mouth at bedtime. Patient not taking: Reported on 05/18/2022 12/20/21   Claiborne Rigg, NP  hydrOXYzine (ATARAX) 10 MG tablet Take 1 tablet (10 mg total) by mouth 3 (three) times daily as needed. NEEDS PASS Patient not taking: Reported on 05/18/2022 05/24/21   Claiborne Rigg, NP  ibuprofen (ADVIL) 200 MG tablet Take 200 mg by  mouth every 6 (six) hours as needed for moderate pain.    [provider]  ibuprofen (ADVIL) 800 MG tablet Take 1 tablet (800 mg total) by mouth every 8 (eight) hours as needed. Patient not taking: Reported on 05/18/2022 12/20/21   Claiborne Rigg, NP  naproxen (NAPROSYN) 500 MG tablet Take 1 tablet (500 mg total) by mouth 2 (two) times daily. Patient not taking: Reported on 05/18/2022 03/15/22   Darrick Grinder, PA-C  predniSONE (DELTASONE) 20 MG tablet Take 2 tablets daily with breakfast. Patient not taking: Reported on 05/18/2022 04/06/22   [name], PA-C  propranolol (INDERAL) 10 MG tablet TAKE 1 TABLET (10 MG TOTAL) BY MOUTH 2 (TWO) TIMES DAILY AS NEEDED (PALPITATIONS.). Patient not taking: Reported on 05/18/2022 01/02/22   Hoy Register, MD  tiZANidine (ZANAFLEX) 4 MG tablet Take 1 tablet (4 mg total) by mouth at bedtime. Patient not taking: Reported on 05/18/2022 04/06/22   [name], PA-C      Allergies    Perflutren and Risperidone    Review of Systems   Review of Systems  Constitutional:  Negative for chills and fever.  Respiratory:  Negative for shortness of breath.   Cardiovascular:  Positive for chest pain. Negative for palpitations and leg swelling.  Gastrointestinal:  Negative for abdominal pain, nausea and vomiting.  Musculoskeletal:  Positive for back pain.  Neurological:  Negative for light-headedness.  All other systems reviewed and are  negative.   Physical Exam Updated Vital Signs BP (!) 144/93   Pulse 89   Temp 98.5 F (36.9 C) (Oral)   Resp (!) 24   Ht 6\' 3"  (1.905 m)   Wt (!) 181.4 kg   SpO2 99%   BMI 50.00 kg/m  Physical Exam Vitals and nursing note reviewed.  Constitutional:      General: He is not in acute distress.    Appearance: Normal appearance. He is not ill-appearing.  HENT:     Head: Normocephalic and atraumatic.     Nose: Nose normal.  Eyes:     General: No scleral icterus.    Extraocular Movements: Extraocular movements  intact.     Conjunctiva/sclera: Conjunctivae normal.  Cardiovascular:     Rate and Rhythm: Normal rate and regular rhythm.     Pulses: Normal pulses.     Heart sounds: Normal heart sounds.  Pulmonary:     Effort: Pulmonary effort is normal. No respiratory distress.     Breath sounds: Normal breath sounds. No wheezing or rales.  Abdominal:     General: There is no distension.     Tenderness: There is no abdominal tenderness.  Musculoskeletal:        General: Normal range of motion.     Cervical back: Normal range of motion.     Comments: Cervical, thoracic, lumbar spine without tenderness palpation or step-offs.  There is some generalized paraspinal muscle tenderness to palpation.  Full range of motion bilateral upper and lower extremities.  Skin:    General: Skin is warm and dry.  Neurological:     General: No focal deficit present.     Mental Status: He is alert. Mental status is at baseline.     ED Results / Procedures / Treatments   Labs (all labs ordered are listed, but only abnormal results are displayed) Labs Reviewed  BASIC METABOLIC PANEL  CBC WITH DIFFERENTIAL/PLATELET  MAGNESIUM  TROPONIN I (HIGH SENSITIVITY)    EKG EKG Interpretation  Date/Time:  Wednesday Jun 06 2022 14:39:16 EDT Ventricular Rate:  92 PR Interval:  166 QRS Duration: 104 QT Interval:  321 QTC Calculation: 397 R Axis:   88 Text Interpretation: Sinus rhythm Low voltage, precordial leads No significant change since last tracing Confirmed by Elayne Snare ([id number]) on 06/06/2022 4:13:14 PM  Radiology No results found.  Procedures Procedures    Medications Ordered in ED Medications  lactated ringers bolus 1,000 mL (has no administration in time range)  ondansetron (ZOFRAN) injection 4 mg (has no administration in time range)  morphine (PF) 4 MG/ML injection 4 mg (has no administration in time range)  methocarbamol (ROBAXIN) tablet 500 mg (has no administration in time range)    ED  Course/ Medical Decision Making/ A&P                             Medical Decision Making Amount and/or Complexity of Data Reviewed Labs: ordered. Radiology: ordered.  Risk Prescription drug management.   27 year old male with reported history of dysautonomia presents today for concerns of chest pain, and low back pain which he feels as if it is a flareup of his chronic low back pain.  He has been taking his home medications with some relief.  However the chest pain is new as of this morning.  Hemodynamically stable.  Low risk for PE on Wells criteria.  Also he is without tachypnea, tachycardia, or hypoxia.  ACS  workup obtained, and it is reassuring.  No evidence of ACS.  Fluids and medications provided with improvement in symptoms.  Patient is appropriate for discharge.  He will follow-up with his cardiologist.  Strict return precautions given.  Patient voices understanding and is in agreement with plan.   Final Clinical Impression(s) / ED Diagnoses Final diagnoses:  Chronic low back pain, unspecified back pain laterality, unspecified whether sciatica present  Atypical chest pain    Rx / DC Orders ED Discharge Orders     None         Marita Kansas, PA-C 06/06/22 1916    Rexford Maus, DO 06/06/22 2342

## 2022-06-06 NOTE — Discharge Instructions (Addendum)
Workup today was reassuring.  You had improvement in your symptoms following medications and fluids.  Follow-up with your cardiologist.  For any concerning symptoms return to the emergency department.  Continue taking your home medications.

## 2022-06-06 NOTE — ED Notes (Signed)
Patient Alert and oriented to baseline. Stable and ambulatory to baseline. Patient verbalized understanding of the discharge instructions.  Patient belongings were taken by the patient.   

## 2022-06-06 NOTE — ED Triage Notes (Signed)
Pt to er, pt states that he is here for back pain and chest pain, states that he has had the chest pain since he woke up this am.  States that his back pain has been going on for the past week.  Pt states that he has dysautonomia and doesn't have a neuro appointment till august

## 2022-08-20 ENCOUNTER — Emergency Department (HOSPITAL_COMMUNITY): Payer: Medicaid Other

## 2022-08-20 ENCOUNTER — Encounter (HOSPITAL_COMMUNITY): Payer: Self-pay

## 2022-08-20 ENCOUNTER — Emergency Department (HOSPITAL_COMMUNITY)
Admission: EM | Admit: 2022-08-20 | Discharge: 2022-08-20 | Disposition: A | Discharge status: Home / Self Care | Payer: Medicaid Other | Attending: Emergency Medicine | Admitting: Emergency Medicine

## 2022-08-20 ENCOUNTER — Other Ambulatory Visit: Payer: Self-pay

## 2022-08-20 DIAGNOSIS — I10 Essential (primary) hypertension: Secondary | ICD-10-CM | POA: Diagnosis not present

## 2022-08-20 DIAGNOSIS — J45909 Unspecified asthma, uncomplicated: Secondary | ICD-10-CM | POA: Insufficient documentation

## 2022-08-20 DIAGNOSIS — R079 Chest pain, unspecified: Secondary | ICD-10-CM | POA: Insufficient documentation

## 2022-08-20 LAB — COMPREHENSIVE METABOLIC PANEL
ALT: 29 U/L (ref 0–44)
AST: 21 U/L (ref 15–41)
Albumin: 3.7 g/dL (ref 3.5–5.0)
Alkaline Phosphatase: 71 U/L (ref 38–126)
Anion gap: 8 (ref 5–15)
BUN: 9 mg/dL (ref 6–20)
CO2: 24 mmol/L (ref 22–32)
Calcium: 9 mg/dL (ref 8.9–10.3)
Chloride: 106 mmol/L (ref 98–111)
Creatinine, Ser: 0.74 mg/dL (ref 0.61–1.24)
GFR, Estimated: >60 mL/min (ref >60)
Glucose, Bld: 115 mg/dL — ABNORMAL HIGH (ref 70–99)
Potassium: 3.6 mmol/L (ref 3.5–5.1)
Sodium: 138 mmol/L (ref 135–145)
Total Bilirubin: 0.4 mg/dL (ref 0.3–1.2)
Total Protein: 6.6 g/dL (ref 6.5–8.1)

## 2022-08-20 LAB — TROPONIN I (HIGH SENSITIVITY)
Troponin I (High Sensitivity): 4 ng/L (ref <18)
Troponin I (High Sensitivity): 4 ng/L (ref <18)

## 2022-08-20 LAB — CBC
HCT: 42.5 % (ref 39.0–52.0)
Hemoglobin: 13.3 g/dL (ref 13.0–17.0)
MCH: 25.6 pg — ABNORMAL LOW (ref 26.0–34.0)
MCHC: 31.3 g/dL (ref 30.0–36.0)
MCV: 81.7 fL (ref 80.0–100.0)
Platelets: 354 10*3/uL (ref 150–400)
RBC: 5.2 MIL/uL (ref 4.22–5.81)
RDW: 13.7 % (ref 11.5–15.5)
WBC: 12.1 10*3/uL — ABNORMAL HIGH (ref 4.0–10.5)
nRBC: 0 % (ref 0.0–0.2)

## 2022-08-20 LAB — LIPASE, BLOOD: Lipase: 28 U/L (ref 11–51)

## 2022-08-20 MED ORDER — ONDANSETRON HCL 4 MG/2ML IJ SOLN
4.0000 mg | Freq: Once | INTRAMUSCULAR | Status: AC
  Administered 2022-08-20: 4 mg via INTRAVENOUS
  Filled 2022-08-20: qty 2

## 2022-08-20 MED ORDER — LORAZEPAM 1 MG PO TABS
1.0000 mg | ORAL_TABLET | Freq: Once | ORAL | Status: AC
  Administered 2022-08-20: 1 mg via ORAL
  Filled 2022-08-20: qty 1

## 2022-08-20 MED ORDER — KETOROLAC TROMETHAMINE 15 MG/ML IJ SOLN
15.0000 mg | Freq: Once | INTRAMUSCULAR | Status: AC
  Administered 2022-08-20: 15 mg via INTRAVENOUS
  Filled 2022-08-20: qty 1

## 2022-08-20 MED ORDER — FAMOTIDINE IN NACL 20-0.9 MG/50ML-% IV SOLN
20.0000 mg | INTRAVENOUS | Status: AC
  Administered 2022-08-20: 20 mg via INTRAVENOUS
  Filled 2022-08-20: qty 50

## 2022-08-20 NOTE — Discharge Instructions (Signed)
Your workup in the ED today was reassuring and did not reveal a concerning cause of your symptoms.  We advise follow-up with your cardiologist as well as your primary care doctor.  Return to the ED for new or concerning symptoms.

## 2022-08-20 NOTE — ED Provider Notes (Signed)
[place] EMERGENCY DEPARTMENT AT [facility] Provider Note   CSN: [id number] Arrival date & time: 08/20/22  0019     History  Chief Complaint  Patient presents with   Chest Pain    Steven Diaz is a 27 y.o. male.  27 year old male with a history of asthma, bipolar 1 disorder, hypertension, PTSD, reported dysautonomia presents to the emergency department for evaluation of chest pain.  He describes his chest pain as a pressure-like sensation which is present also in his mid back.  He has had chest pain in the past, "but never this bad".  He has not had any fevers, hemoptysis, syncope, lower extremity edema or asymmetric swelling.  No recent surgeries or hospitalizations.  No recent medication changes.  Does have an established cardiologist at [facility]  The history is provided by the patient and a significant other. No language interpreter was used.  Chest Pain      Home Medications Prior to Admission medications   Medication Sig Start Date End Date Taking? Authorizing Provider  ibuprofen (ADVIL) 200 MG tablet Take 200 mg by mouth every 6 (six) hours as needed for moderate pain.   Yes [provider]  meloxicam (MOBIC) 15 MG tablet Take 15 mg by mouth daily.   Yes [provider]  tiZANidine (ZANAFLEX) 4 MG tablet Take 1 tablet (4 mg total) by mouth at bedtime. 04/06/22  Yes [name], PA-C      Allergies    Propranolol, Perflutren, and Risperidone    Review of Systems   Review of Systems  Cardiovascular:  Positive for chest pain.  Ten systems reviewed and are negative for acute change, except as noted in the HPI.    Physical Exam Updated Vital Signs BP 135/77 (BP Location: Left Arm)   Pulse 87   Temp 98.2 F (36.8 C) (Oral)   Resp 20   Ht 6\' 3"  (1.905 m)   Wt (!) 203.4 kg   SpO2 96%   BMI 56.05 kg/m   Physical Exam Vitals and nursing note reviewed.  Constitutional:      General: He is not in acute distress.    Appearance: He  is well-developed. He is not diaphoretic.     Comments: Obese male. Nontoxic appearing. NAD.  HENT:     Head: Normocephalic and atraumatic.  Eyes:     General: No scleral icterus.    Conjunctiva/sclera: Conjunctivae normal.  Cardiovascular:     Rate and Rhythm: Normal rate and regular rhythm.     Pulses: Normal pulses.  Pulmonary:     Effort: Pulmonary effort is normal. No respiratory distress.  Musculoskeletal:        General: Normal range of motion.     Cervical back: Normal range of motion.     Comments: No BLE pitting or asymmetric edema.  Skin:    General: Skin is warm and dry.     Coloration: Skin is not pale.     Findings: No erythema or rash.  Neurological:     Mental Status: He is alert and oriented to person, place, and time.     Coordination: Coordination normal.  Psychiatric:        Behavior: Behavior normal.     ED Results / Procedures / Treatments   Labs (all labs ordered are listed, but only abnormal results are displayed) Labs Reviewed  CBC - Abnormal; Notable for the following components:      Result Value   WBC 12.1 (*)  MCH 25.6 (*)    All other components within normal limits  COMPREHENSIVE METABOLIC PANEL - Abnormal; Notable for the following components:   Glucose, Bld 115 (*)    All other components within normal limits  LIPASE, BLOOD  TROPONIN I (HIGH SENSITIVITY)  TROPONIN I (HIGH SENSITIVITY)    EKG EKG Interpretation Date/Time:  Monday August 20 2022 00:33:19 EDT Ventricular Rate:  103 PR Interval:  181 QRS Duration:  106 QT Interval:  314 QTC Calculation: 411 R Axis:   58  Text Interpretation: Sinus tachycardia Low voltage, precordial leads Non-specific ST-t changes Confirmed by Cathren Laine [phone]) on 08/20/2022 12:45:25 AM  Radiology DG Chest Port 1 View  Result Date: 08/20/2022 CLINICAL DATA:  Pain EXAM: PORTABLE CHEST 1 VIEW COMPARISON:  Chest x-ray 06/06/2022 FINDINGS: The heart size and mediastinal contours are within normal  limits. Both lungs are clear. The visualized skeletal structures are unremarkable. IMPRESSION: No active disease. Electronically Signed   By: Darliss Cheney M.D.   On: 08/20/2022 01:48    Procedures Procedures    Medications Ordered in ED Medications  ketorolac (TORADOL) 15 MG/ML injection 15 mg (15 mg Intravenous Given 08/20/22 0152)  ondansetron (ZOFRAN) injection 4 mg (4 mg Intravenous Given 08/20/22 0338)  famotidine (PEPCID) IVPB 20 mg premix (0 mg Intravenous Stopped 08/20/22 0444)  LORazepam (ATIVAN) tablet 1 mg (1 mg Oral Given 08/20/22 4132)    ED Course/ Medical Decision Making/ A&P Clinical Course as of 08/20/22 0449  Mon Aug 20, 2022  4401 Patient reports some symptomatic improvement following Pepcid and Ativan.  His delta troponin is normal, flat. [KH]    Clinical Course User Index [KH] Antony Madura, PA-C                             Medical Decision Making Amount and/or Complexity of Data Reviewed Labs: ordered. Radiology: ordered.  Risk Prescription drug management.   This patient presents to the ED for concern of chest pain, this involves an extensive number of treatment options, and is a complaint that carries with it a high risk of complications and morbidity.  The differential diagnosis includes ACS vs PNA vs PTX vs pleural effusion vs PUD vs anxiety.   Co morbidities that complicate the patient evaluation  Morbidy obesity Asthma HTN Bipolar d/o   Additional history obtained:  Additional history obtained from partner, at bedside External records from outside source obtained and reviewed including [facility] cardiology note from appt in April 2024.   Lab Tests:  I Ordered, and personally interpreted labs.  The pertinent results include:  WBC 12.1. Otherwise normal CBC, CMP, lipase. Negative troponin x2.   Imaging Studies ordered:  I ordered imaging studies including CXR  I independently visualized and interpreted imaging which showed no acute  cardiopulmonary abnormality. I agree with the radiologist interpretation   Cardiac Monitoring:  The patient was maintained on a cardiac monitor.  I personally viewed and interpreted the cardiac monitored which showed an underlying rhythm of: Sinus tachycardia > NSR   Medicines ordered and prescription drug management:  I ordered medication including Toradol, Pepcid, and Ativan for chest pressure  Reevaluation of the patient after these medicines showed that the patient improved after Ativan I have reviewed the patients home medicines and have made adjustments as needed   Test Considered:   D dimer; however, overall low risk for PE. Negative D dimer in April 2024.   Problem List / ED Course:  Patient presenting for chest pressure. Low suspicion for emergent cardiac etiology given reassuring workup today.  EKG is nonischemic and troponin negative x2.   Chest x-ray without evidence of mediastinal widening to suggest dissection.  No pneumothorax, pneumonia, pleural effusion.   Pulmonary embolus further considered; however, patient without sustained tachycardia, tachypnea, dyspnea, hypoxia.  Hx of negative D dimer in April 2024. Given his psychiatric history, I feel there may be a component of anxiety contributing to his ongoing pain.  He has been seen a number of times in the past by cardiology for complaint of palpitations.  He denies palpitations today.  There is no evidence of concerning arrhythmia on EKG.   Reevaluation:  After the interventions noted above, I reevaluated the patient and found that they have :improved   Social Determinants of Health:  Good social support   Dispostion:  After consideration of the diagnostic results and the patients response to treatment, I feel that the patent would benefit from Cardiology follow up and continuation on current prescribed medications. Return precautions discussed and provided. Patient discharged in stable condition with no  unaddressed concerns.         Final Clinical Impression(s) / ED Diagnoses Final diagnoses:  Nonspecific chest pain    Rx / DC Orders ED Discharge Orders     None         Antony Madura, PA-C 08/20/22 0453    Cathren Laine, MD 08/20/22 [phone]

## 2022-08-20 NOTE — ED Triage Notes (Signed)
Pt. Arrives POV c/o chest pain x3 day. Pain extends to the back but does not radiate anywhere else. Pt. Took his muscle relaxer for his pain, but has not had any relief. Pt. Endorses vomiting yesterday and nausea today.

## 2022-09-16 ENCOUNTER — Other Ambulatory Visit: Payer: Self-pay

## 2022-09-16 ENCOUNTER — Emergency Department (HOSPITAL_COMMUNITY)
Admission: EM | Admit: 2022-09-16 | Discharge: 2022-09-16 | Disposition: A | Discharge status: Home / Self Care | Payer: Medicaid Other | Attending: Emergency Medicine | Admitting: Emergency Medicine

## 2022-09-16 DIAGNOSIS — L03115 Cellulitis of right lower limb: Secondary | ICD-10-CM | POA: Diagnosis not present

## 2022-09-16 DIAGNOSIS — Z23 Encounter for immunization: Secondary | ICD-10-CM | POA: Diagnosis not present

## 2022-09-16 DIAGNOSIS — L089 Local infection of the skin and subcutaneous tissue, unspecified: Secondary | ICD-10-CM | POA: Diagnosis not present

## 2022-09-16 DIAGNOSIS — R2241 Localized swelling, mass and lump, right lower limb: Secondary | ICD-10-CM | POA: Diagnosis present

## 2022-09-16 LAB — BASIC METABOLIC PANEL
Anion gap: 9 (ref 5–15)
BUN: 12 mg/dL (ref 6–20)
CO2: 23 mmol/L (ref 22–32)
Calcium: 9.5 mg/dL (ref 8.9–10.3)
Chloride: 105 mmol/L (ref 98–111)
Creatinine, Ser: 0.77 mg/dL (ref 0.61–1.24)
GFR, Estimated: >60 mL/min (ref >60)
Glucose, Bld: 121 mg/dL — ABNORMAL HIGH (ref 70–99)
Potassium: 4 mmol/L (ref 3.5–5.1)
Sodium: 137 mmol/L (ref 135–145)

## 2022-09-16 LAB — CBC
HCT: 47.2 % (ref 39.0–52.0)
Hemoglobin: 15 g/dL (ref 13.0–17.0)
MCH: 26 pg (ref 26.0–34.0)
MCHC: 31.8 g/dL (ref 30.0–36.0)
MCV: 81.9 fL (ref 80.0–100.0)
Platelets: 177 10*3/uL (ref 150–400)
RBC: 5.76 MIL/uL (ref 4.22–5.81)
RDW: 13.8 % (ref 11.5–15.5)
WBC: 11.4 10*3/uL — ABNORMAL HIGH (ref 4.0–10.5)
nRBC: 0 % (ref 0.0–0.2)

## 2022-09-16 MED ORDER — TETANUS-DIPHTH-ACELL PERTUSSIS 5-2.5-18.5 LF-MCG/0.5 IM SUSY
0.5000 mL | PREFILLED_SYRINGE | Freq: Once | INTRAMUSCULAR | Status: AC
  Administered 2022-09-16: 0.5 mL via INTRAMUSCULAR
  Filled 2022-09-16: qty 0.5

## 2022-09-16 MED ORDER — DOXYCYCLINE HYCLATE 100 MG PO CAPS: 100.0000 mg | ORAL_CAPSULE | Freq: Two times a day (BID) | ORAL | 0 refills | Status: DC

## 2022-09-16 NOTE — Discharge Instructions (Addendum)
Please follow-up with your primary care doctor, return to the ER if you feel like your symptoms are worsening.  The area should improve with redness following in the next 24 to 48 hours after taking the antibiotics.  I did drain at this area, and it may continue to drain.  This is to prevent from becoming an abscess.  Return to the ER if you get fevers, chills, worsening redness, or swelling.  You can use warm compress, to help the area drain.

## 2022-09-16 NOTE — ED Provider Notes (Signed)
EMERGENCY DEPARTMENT AT [facility] Provider Note   CSN: [id number] Arrival date & time: 09/16/22  1623     History  Chief Complaint  Patient presents with   Abscess    Steven Diaz is a 27 y.o. male, no pertinent past medical history, who presents to the ED secondary to swelling of his right thigh, has been going on for last couple days.  He thinks he may have been bit by a spider.  Does not use any IV drugs.  States his swelling redness has been progressively getting worse, and now it rubs against his thighs, and is here he is having difficulty walking.  Denies any fevers or chills.     Home Medications Prior to Admission medications   Medication Sig Start Date End Date Taking? Authorizing Provider  doxycycline (VIBRAMYCIN) 100 MG capsule Take 1 capsule (100 mg total) by mouth 2 (two) times daily. 09/16/22  Yes Shloma Roggenkamp L, PA  ibuprofen (ADVIL) 200 MG tablet Take 200 mg by mouth every 6 (six) hours as needed for moderate pain.    [provider]  meloxicam (MOBIC) 15 MG tablet Take 15 mg by mouth daily.    [provider]  tiZANidine (ZANAFLEX) 4 MG tablet Take 1 tablet (4 mg total) by mouth at bedtime. 04/06/22   [name], PA-C      Allergies    Propranolol, Perflutren, and Risperidone    Review of Systems   Review of Systems  Constitutional:  Negative for fever.  Skin:  Positive for rash.    Physical Exam Updated Vital Signs BP (!) 142/98   Pulse 97   Temp 98.2 F (36.8 C) (Oral)   Resp (!) 22   Ht 6\' 3"  (1.905 m)   Wt (!) 190.5 kg   SpO2 96%   BMI 52.50 kg/m  Physical Exam Vitals and nursing note reviewed.  Constitutional:      General: He is not in acute distress.    Appearance: He is well-developed.  HENT:     Head: Normocephalic and atraumatic.  Eyes:     Conjunctiva/sclera: Conjunctivae normal.  Cardiovascular:     Rate and Rhythm: Normal rate and regular rhythm.     Heart sounds: No murmur  heard. Pulmonary:     Effort: Pulmonary effort is normal. No respiratory distress.  Musculoskeletal:        General: No swelling.     Cervical back: Neck supple.  Skin:    General: Skin is warm and dry.     Capillary Refill: Capillary refill takes less than 2 seconds.     Comments: Erythema and induration, around 3 cm x 3 cm lesion, on the right inner thigh, with central punctate.  No fluctuance  Neurological:     Mental Status: He is alert.  Psychiatric:        Mood and Affect: Mood normal.     ED Results / Procedures / Treatments   Labs (all labs ordered are listed, but only abnormal results are displayed) Labs Reviewed  BASIC METABOLIC PANEL - Abnormal; Notable for the following components:      Result Value   Glucose, Bld 121 (*)    All other components within normal limits  CBC - Abnormal; Notable for the following components:   WBC 11.4 (*)    All other components within normal limits    EKG None  Radiology No results found.  Procedures .Marland KitchenIncision and Drainage  Date/Time: 09/16/2022 6:59 PM  Performed by: Pete Pelt, PA Authorized by: Pete Pelt, PA   Consent:    Consent obtained:  Verbal   Consent given by:  Patient   Risks, benefits, and alternatives were discussed: yes     Risks discussed:  Bleeding, damage to other organs, infection, incomplete drainage and pain   Alternatives discussed:  No treatment Universal protocol:    Patient identity confirmed:  Verbally with patient Location:    Type:  Fluid collection   Size:  2mm   Location:  Lower extremity   Lower extremity location:  Leg   Leg location:  R upper leg Pre-procedure details:    Skin preparation:  Chlorhexidine Sedation:    Sedation type:  None Anesthesia:    Anesthesia method:  None Procedure type:    Complexity:  Simple Procedure details:    Incision type: 18 gauge needle stab incision.   Drainage:  Bloody and purulent   Drainage amount:  Scant   Wound treatment:   Wound left open   Packing materials:  None Post-procedure details:    Procedure completion:  Tolerated     Medications Ordered in ED Medications  Tdap (BOOSTRIX) injection 0.5 mL (has no administration in time range)    ED Course/ Medical Decision Making/ A&P                                 Medical Decision Making Patient is a 27 year old male, here for a possible spider bite to the right thigh, this been going on for several days.  The area is erythematous, with central punctate.  I utilized an ultrasound, only she is show there is a Cambelle Suchecki pustule, no deep abscess or fistula noted.  We will use an 18-gauge needle, and help with drainage of the pustule.  Placed him on doxycycline for 10 days for treatment of the cellulitis, and update his tetanus given possible bite.  Has no systemic symptoms.  Denies any IV drug use.  Tolerated incision and drainage well.  Placed on doxycycline for treatment.  Discussed post I&D, home treatment  Amount and/or Complexity of Data Reviewed Labs: ordered.  Risk Prescription drug management.    Final Clinical Impression(s) / ED Diagnoses Final diagnoses:  Pustule  Cellulitis of right lower extremity    Rx / DC Orders ED Discharge Orders          Ordered    doxycycline (VIBRAMYCIN) 100 MG capsule  2 times daily        09/16/22 1858              Pete Pelt, PA 09/16/22 1901    Charlynne Pander, MD 09/16/22 2243

## 2022-09-16 NOTE — ED Triage Notes (Signed)
Pt arrived via POV. C/o painful abscess on R inner thigh since yesterday.  AOx4

## 2022-09-23 IMAGING — CT CT PELVIS W/ CM
2 of 4 series · 16 of 46 positions shown, 18 images · IV contrast (OMNIPAQUE 350)
Comparison: None.

CLINICAL DATA: Inflamed area to lower back around the left
sacroiliac joint

EXAM:
CT PELVIS WITH CONTRAST
TECHNIQUE: Multidetector CT imaging of the pelvis was performed using the
standard protocol following the bolus administration of intravenous
contrast.
CONTRAST:  80mL OMNIPAQUE IOHEXOL 350 MG/ML SOLN

[Series 2: axial st · axial · 0.98mm/px · z∈[+1103,+1408]mm · 13 of 71 slices shown, 15 images]
[im 5/71  soft-tissue]
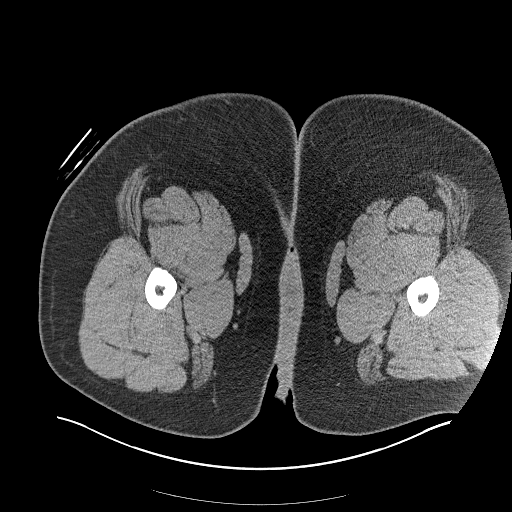
[im 5/71  bone]
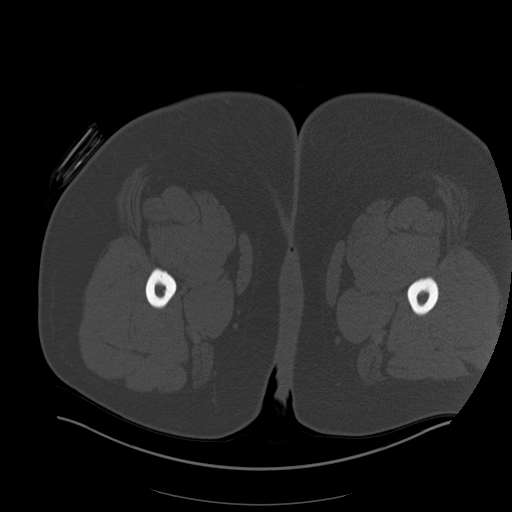
[im 10/71  soft-tissue]
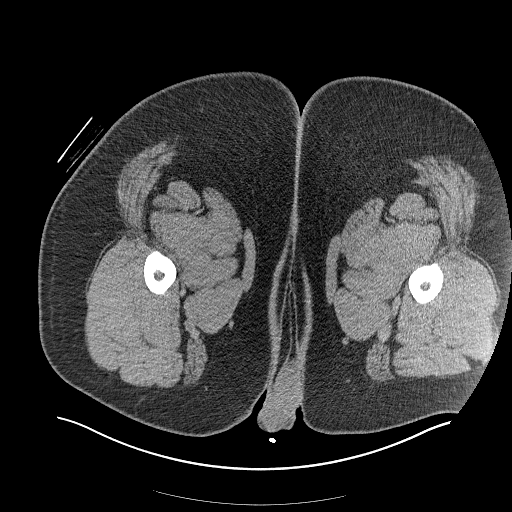
[im 15/71  soft-tissue]
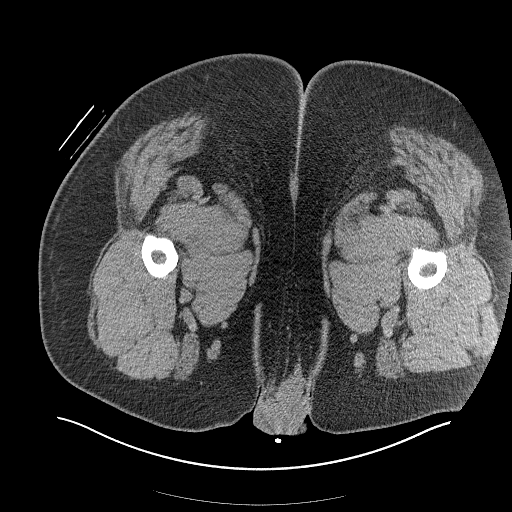
[im 19/71  soft-tissue]
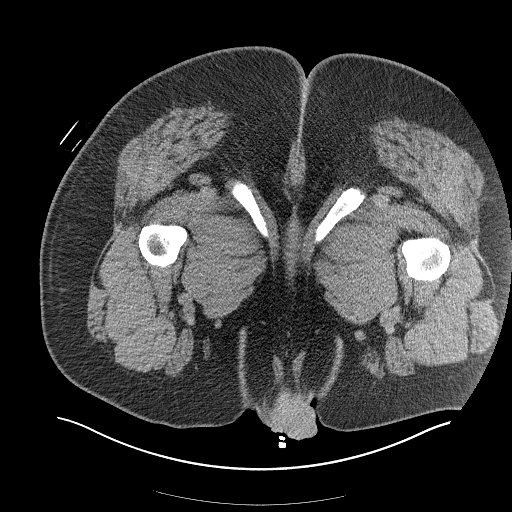
[im 24/71  soft-tissue]
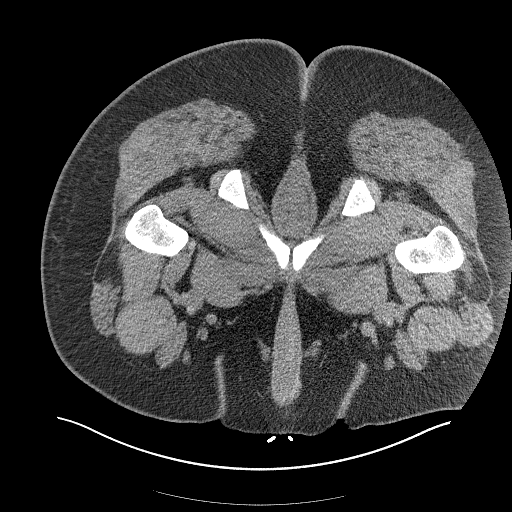
[im 29/71  soft-tissue]
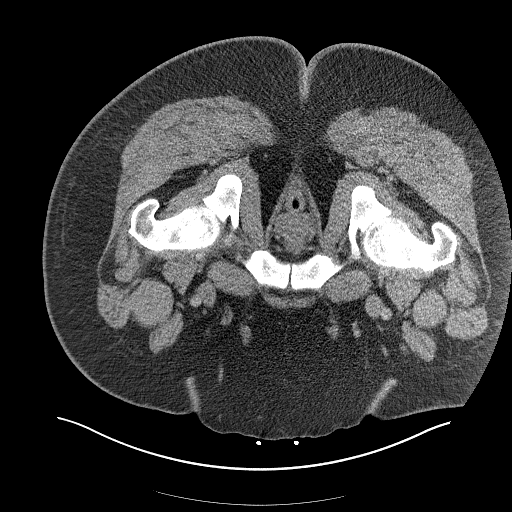
[im 38/71  soft-tissue]
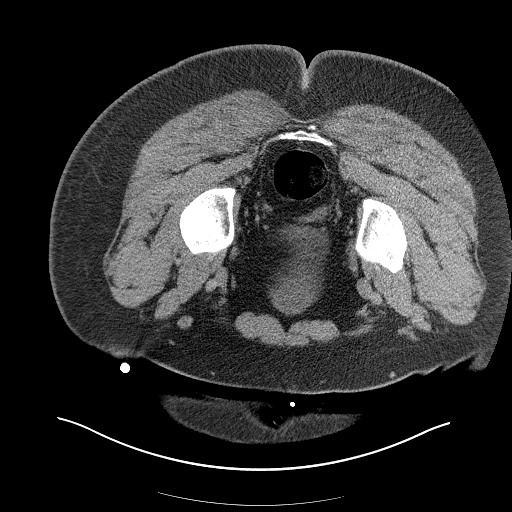
[im 43/71  soft-tissue]
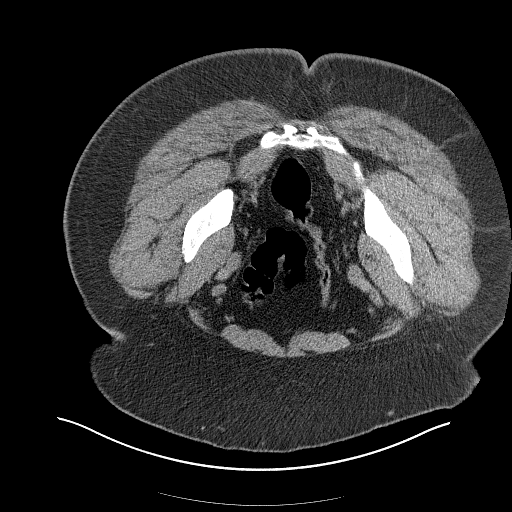
[im 47/71  soft-tissue]
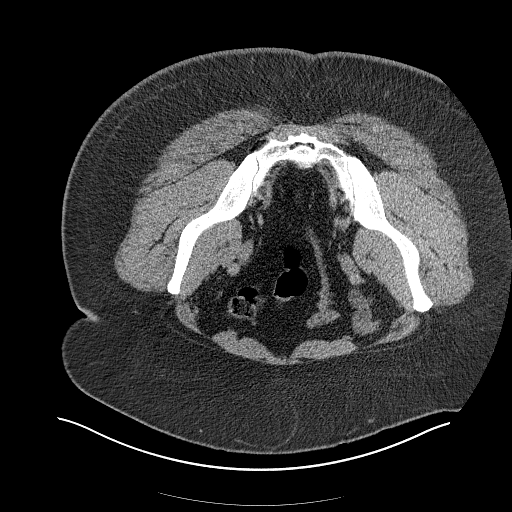
[im 47/71  bone]
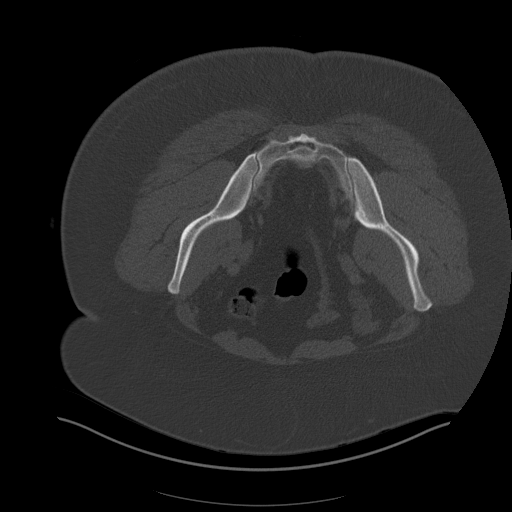
[im 52/71  soft-tissue]
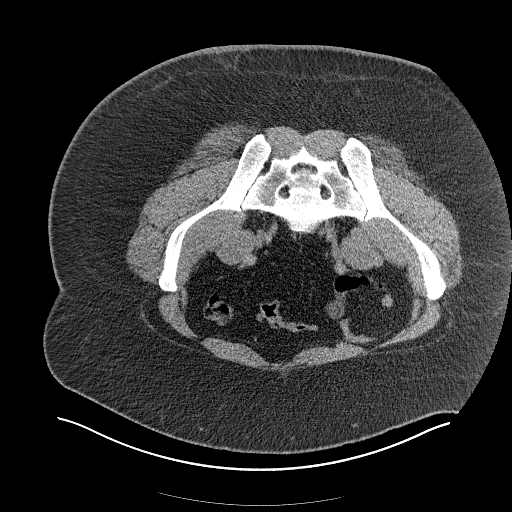
[im 57/71  soft-tissue]
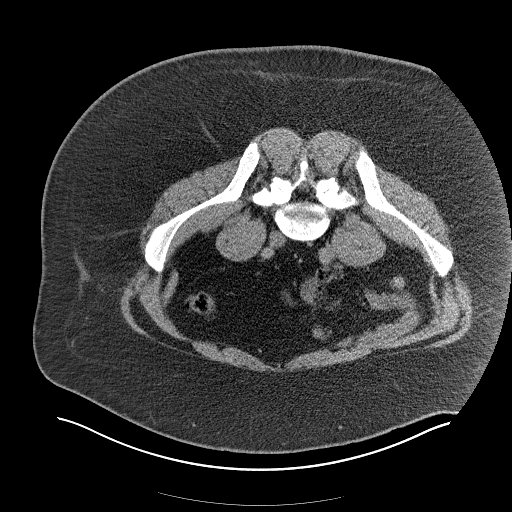
[im 61/71  soft-tissue]
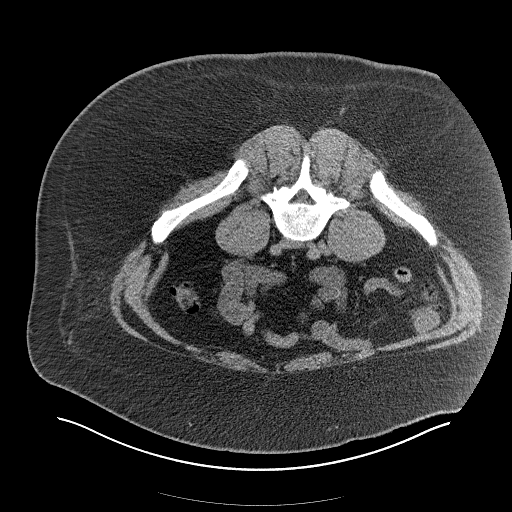
[im 66/71  soft-tissue]
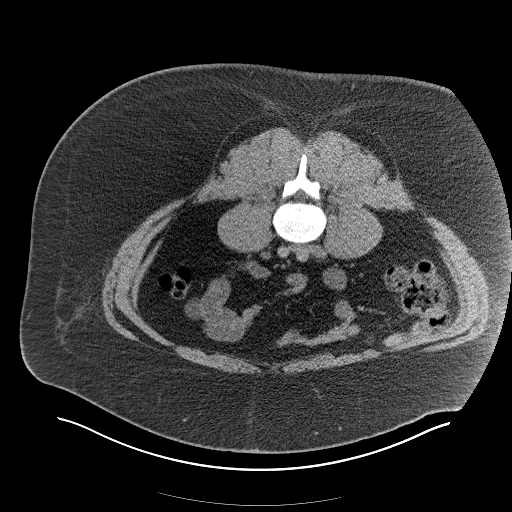

[Series 7: coronal st · coronal · 0.69mm/px · 3 of 205 slices shown]
[im 69/205  soft-tissue]
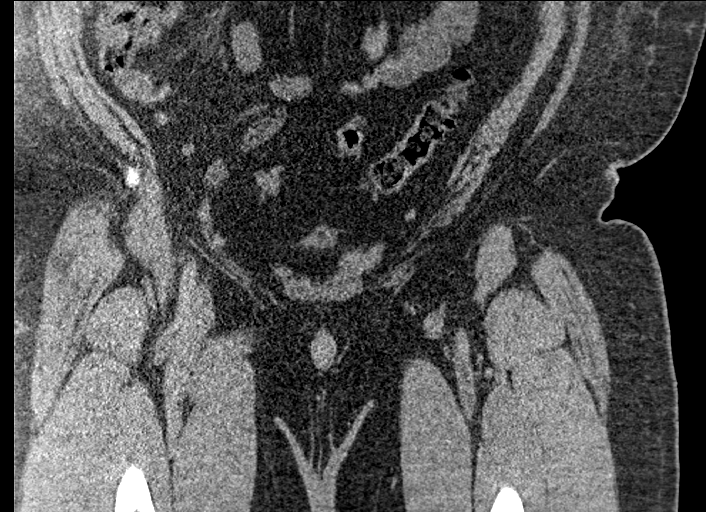
[im 91/205  soft-tissue]
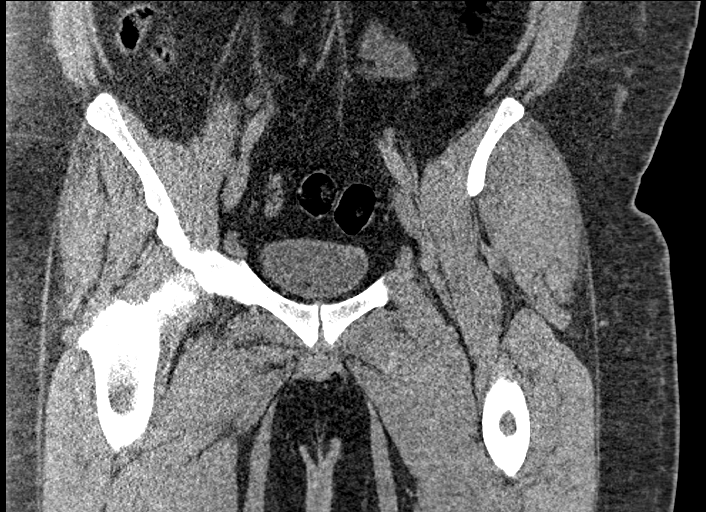
[im 114/205  soft-tissue]
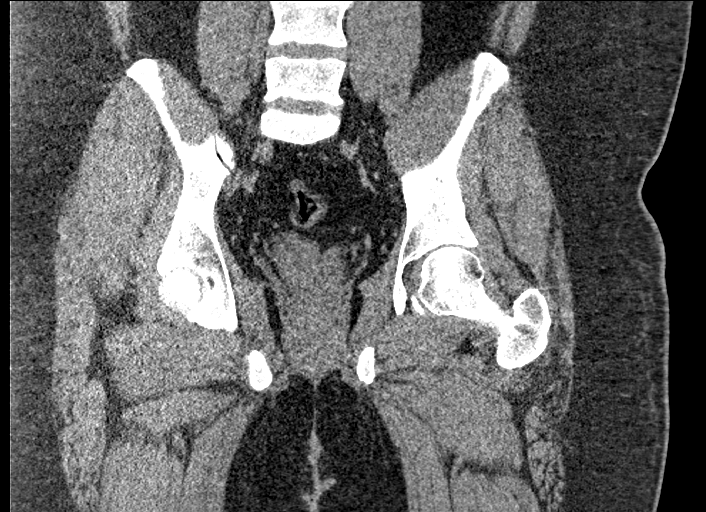

[16 of 46 positions shown; findings below may reference images not displayed]

FINDINGS: Urinary Tract:  No abnormality visualized.

Bowel:  Unremarkable visualized pelvic bowel loops.

Vascular/Lymphatic: No pathologically enlarged lymph nodes. No
significant vascular abnormality seen.

Reproductive:  No mass or other significant abnormality

Other:  None.

Musculoskeletal: No suspicious bone lesions identified.
IMPRESSION: Negative pelvis CT.

## 2022-12-13 ENCOUNTER — Other Ambulatory Visit: Payer: Self-pay

## 2022-12-13 ENCOUNTER — Ambulatory Visit
Admission: EM | Admit: 2022-12-13 | Discharge: 2022-12-13 | Disposition: A | Discharge status: Home / Self Care | Payer: Medicaid Other | Attending: Internal Medicine | Admitting: Internal Medicine

## 2022-12-13 ENCOUNTER — Ambulatory Visit: Payer: Medicaid Other

## 2022-12-13 DIAGNOSIS — M79671 Pain in right foot: Secondary | ICD-10-CM

## 2022-12-13 DIAGNOSIS — M25571 Pain in right ankle and joints of right foot: Secondary | ICD-10-CM

## 2022-12-13 NOTE — ED Provider Notes (Signed)
[name]    CSN: [id number] Arrival date & time: 12/13/22  1800      History   Chief Complaint Chief Complaint  Patient presents with   Ankle Pain    HPI Steven Diaz is a 27 y.o. male.   Patient presents with left ankle pain after 2 different injuries.  Reports that he twisted his ankle and foot over in a hole approximately 2 months ago while walking.  Today he presents with the same injury that occurred about 2 days ago.  He has taken tizanidine and meloxicam for pain.  Reports pain typically occurs with movement and bearing weight.  He has not been evaluated for previous injury occurred.   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 No pertinent surgical history.     Home Medications    Prior to Admission medications   Medication Sig Start Date End Date Taking? Authorizing Provider  doxycycline (VIBRAMYCIN) 100 MG capsule Take 1 capsule (100 mg total) by mouth 2 (two) times daily. 09/16/22   Small, Brooke L, PA  ibuprofen (ADVIL) 200 MG tablet Take 200 mg by mouth every 6 (six) hours as needed for moderate pain.    [provider]  meloxicam (MOBIC) 15 MG tablet Take 15 mg by mouth daily.    [provider]  tiZANidine (ZANAFLEX) 4 MG tablet Take 1 tablet (4 mg total) by mouth at bedtime. 04/06/22   [name], PA-C    Family History Family History  Problem Relation Age of Onset   COPD Mother    Heart disease Mother    Cancer Mother    Heart disease Father    Diabetes Maternal Grandmother    Diabetes Maternal Grandfather    Diabetes Paternal Grandmother    Diabetes Paternal Grandfather     Social History Social  History   Tobacco Use   Smoking status: Former    Current packs/day: 0.00    Average packs/day: 1 pack/day for 15.0 years (15.0 ttl pk-yrs)    Types: Cigarettes    Start date: 02/11/2005    Quit date: 02/12/2020    Years since quitting: 2.8   Smokeless tobacco: Never  Vaping Use   Vaping status: Every Day   Start date: 02/06/2020   Substances: Nicotine, Flavoring  Substance Use Topics   Alcohol use: Not Currently   Drug use: Never     Allergies   Propranolol, Perflutren, and Risperidone   Review of Systems Review of Systems Per HPI  Physical Exam Triage Vital Signs ED Triage Vitals  Encounter Vitals Group     BP 12/13/22 1810 134/80     Systolic BP Percentile --      Diastolic BP Percentile --      Pulse Rate 12/13/22 1810 98     Resp 12/13/22 1810 16     Temp 12/13/22 1810 98.1 F (36.7 C)     Temp Source 12/13/22 1810 Oral     SpO2 12/13/22 1810 96 %     Weight --      Height --      Head Circumference --  Peak Flow --      Pain Score 12/13/22 1811 8     Pain Loc --      Pain Education --      Exclude from Growth Chart --    No data found.  Updated Vital Signs BP 134/80 (BP Location: Left Arm)   Pulse 98   Temp 98.1 F (36.7 C) (Oral)   Resp 16   SpO2 96%   Visual Acuity Right Eye Distance:   Left Eye Distance:   Bilateral Distance:    Right Eye Near:   Left Eye Near:    Bilateral Near:     Physical Exam Constitutional:      General: He is not in acute distress.    Appearance: Normal appearance. He is not toxic-appearing or diaphoretic.  HENT:     Head: Normocephalic and atraumatic.  Eyes:     Extraocular Movements: Extraocular movements intact.     Conjunctiva/sclera: Conjunctivae normal.  Pulmonary:     Effort: Pulmonary effort is normal.  Musculoskeletal:     Comments: Patient has mild swelling with tenderness to palpation present to the lateral malleolus of the right ankle.  Also reports tenderness to palpation to right great toe.   Patient can wiggle toes and bear weight.  Capillary refill and pulses intact.  No discoloration noted.  No lacerations or abrasions noted.  Capillary refill and pulses intact.  Neurological:     General: No focal deficit present.     Mental Status: He is alert and oriented to person, place, and time. Mental status is at baseline.  Psychiatric:        Mood and Affect: Mood normal.        Behavior: Behavior normal.        Thought Content: Thought content normal.        Judgment: Judgment normal.      UC Treatments / Results  Labs (all labs ordered are listed, but only abnormal results are displayed) Labs Reviewed - No data to display  EKG   Radiology DG Ankle Complete Right  Result Date: 12/13/2022 CLINICAL DATA:  Fall in hole, pain EXAM: RIGHT ANKLE - COMPLETE 3+ VIEW COMPARISON:  Foot series today FINDINGS: There is no evidence of fracture, dislocation, or joint effusion. There is no evidence of arthropathy or other focal bone abnormality. Soft tissues are unremarkable. IMPRESSION: Negative. Electronically Signed   By: Charlett Nose M.D.   On: 12/13/2022 20:19   DG Foot Complete Right  Result Date: 12/13/2022 CLINICAL DATA:  Foot pain EXAM: RIGHT FOOT COMPLETE - 3+ VIEW COMPARISON:  None Available. FINDINGS: There is no evidence of fracture or dislocation. There is no evidence of arthropathy or other focal bone abnormality. Soft tissues are unremarkable. IMPRESSION: Negative. Electronically Signed   By: Charlett Nose M.D.   On: 12/13/2022 20:18    Procedures Procedures (including critical care time)  Medications Ordered in UC Medications - No data to display  Initial Impression / Assessment and Plan / UC Course  I have reviewed the triage vital signs and the nursing notes.  Pertinent labs & imaging results that were available during my care of the patient were reviewed by me and considered in my medical decision making (see chart for details).     X-rays negative for any  acute bony abnormality.  Suspect ankle sprain.  Ace wrap applied by clinical staff in urgent care and patient advised of RICE.  Given two different injuries, recommend that he see  orthopedist for further evaluation and management so he was provided with contact information.  Advised supportive care and symptom management.  Advised strict follow-up precautions.  Patient verbalized understanding and was agreeable with plan. Final Clinical Impressions(s) / UC Diagnoses   Final diagnoses:  Acute right ankle pain  Right foot pain     Discharge Instructions      I will call if x-ray is abnormal.  Ace wrap applied today to help with support and stability.  Elevate and apply ice.  Follow-up with orthopedist.     ED Prescriptions   None    PDMP not reviewed this encounter.   Gustavus Bryant, [place] 12/14/22 [phone]

## 2022-12-13 NOTE — Discharge Instructions (Addendum)
I will call if x-ray is abnormal.  Ace wrap applied today to help with support and stability.  Elevate and apply ice.  Follow-up with orthopedist.

## 2022-12-13 NOTE — ED Triage Notes (Signed)
Pt states he fell in a hole 2 months ago and injured his right ankle. Pt states today he fell in a hole again.  States he has been taking tizanidine  and meloxicam with no relief.

## 2022-12-14 ENCOUNTER — Ambulatory Visit: Payer: Medicaid Other

## 2022-12-27 ENCOUNTER — Encounter (HOSPITAL_COMMUNITY): Payer: Self-pay | Admitting: *Deleted

## 2022-12-27 ENCOUNTER — Other Ambulatory Visit: Payer: Self-pay

## 2022-12-27 ENCOUNTER — Emergency Department (HOSPITAL_COMMUNITY)
Admission: EM | Admit: 2022-12-27 | Discharge: 2022-12-27 | Disposition: A | Discharge status: Home / Self Care | Payer: Medicaid Other | Attending: Emergency Medicine | Admitting: Emergency Medicine

## 2022-12-27 ENCOUNTER — Emergency Department (HOSPITAL_COMMUNITY): Payer: Medicaid Other

## 2022-12-27 DIAGNOSIS — R7309 Other abnormal glucose: Secondary | ICD-10-CM | POA: Insufficient documentation

## 2022-12-27 DIAGNOSIS — E871 Hypo-osmolality and hyponatremia: Secondary | ICD-10-CM | POA: Diagnosis not present

## 2022-12-27 DIAGNOSIS — R519 Headache, unspecified: Secondary | ICD-10-CM | POA: Insufficient documentation

## 2022-12-27 DIAGNOSIS — Z20822 Contact with and (suspected) exposure to covid-19: Secondary | ICD-10-CM | POA: Diagnosis not present

## 2022-12-27 DIAGNOSIS — R112 Nausea with vomiting, unspecified: Secondary | ICD-10-CM | POA: Insufficient documentation

## 2022-12-27 DIAGNOSIS — H53149 Visual discomfort, unspecified: Secondary | ICD-10-CM | POA: Insufficient documentation

## 2022-12-27 DIAGNOSIS — R739 Hyperglycemia, unspecified: Secondary | ICD-10-CM

## 2022-12-27 LAB — URINALYSIS, W/ REFLEX TO CULTURE (INFECTION SUSPECTED)
Bacteria, UA: NONE SEEN
Bilirubin Urine: NEGATIVE
Glucose, UA: NEGATIVE mg/dL
Hgb urine dipstick: NEGATIVE
Ketones, ur: NEGATIVE mg/dL
Leukocytes,Ua: NEGATIVE
Nitrite: NEGATIVE
Protein, ur: NEGATIVE mg/dL
Specific Gravity, Urine: 1.027 (ref 1.005–1.030)
pH: 5 (ref 5.0–8.0)

## 2022-12-27 LAB — COMPREHENSIVE METABOLIC PANEL
ALT: 49 U/L — ABNORMAL HIGH (ref 0–44)
AST: 45 U/L — ABNORMAL HIGH (ref 15–41)
Albumin: 3.6 g/dL (ref 3.5–5.0)
Alkaline Phosphatase: 68 U/L (ref 38–126)
Anion gap: 11 (ref 5–15)
BUN: 13 mg/dL (ref 6–20)
CO2: 23 mmol/L (ref 22–32)
Calcium: 8.8 mg/dL — ABNORMAL LOW (ref 8.9–10.3)
Chloride: 100 mmol/L (ref 98–111)
Creatinine, Ser: 0.88 mg/dL (ref 0.61–1.24)
GFR, Estimated: >60 mL/min (ref >60)
Glucose, Bld: 110 mg/dL — ABNORMAL HIGH (ref 70–99)
Potassium: 3.7 mmol/L (ref 3.5–5.1)
Sodium: 134 mmol/L — ABNORMAL LOW (ref 135–145)
Total Bilirubin: 0.7 mg/dL (ref <1.2)
Total Protein: 7.1 g/dL (ref 6.5–8.1)

## 2022-12-27 LAB — CBC WITH DIFFERENTIAL/PLATELET
Abs Immature Granulocytes: 0.03 10*3/uL (ref 0.00–0.07)
Basophils Absolute: 0 10*3/uL (ref 0.0–0.1)
Basophils Relative: 0 %
Eosinophils Absolute: 0 10*3/uL (ref 0.0–0.5)
Eosinophils Relative: 1 %
HCT: 44.5 % (ref 39.0–52.0)
Hemoglobin: 14.2 g/dL (ref 13.0–17.0)
Immature Granulocytes: 1 %
Lymphocytes Relative: 26 %
Lymphs Abs: 1 10*3/uL (ref 0.7–4.0)
MCH: 25.9 pg — ABNORMAL LOW (ref 26.0–34.0)
MCHC: 31.9 g/dL (ref 30.0–36.0)
MCV: 81.2 fL (ref 80.0–100.0)
Monocytes Absolute: 0.5 10*3/uL (ref 0.1–1.0)
Monocytes Relative: 11 %
Neutro Abs: 2.5 10*3/uL (ref 1.7–7.7)
Neutrophils Relative %: 61 %
Platelets: 193 10*3/uL (ref 150–400)
RBC: 5.48 MIL/uL (ref 4.22–5.81)
RDW: 14.4 % (ref 11.5–15.5)
WBC: 4 10*3/uL (ref 4.0–10.5)
nRBC: 0 % (ref 0.0–0.2)

## 2022-12-27 LAB — SARS CORONAVIRUS 2 BY RT PCR: SARS Coronavirus 2 by RT PCR: NEGATIVE

## 2022-12-27 LAB — I-STAT CG4 LACTIC ACID, ED: Lactic Acid, Venous: 0.7 mmol/L (ref 0.5–1.9)

## 2022-12-27 MED ORDER — SODIUM CHLORIDE 0.9 % IV BOLUS
1000.0000 mL | Freq: Once | INTRAVENOUS | Status: AC
  Administered 2022-12-27: 1000 mL via INTRAVENOUS

## 2022-12-27 MED ORDER — ONDANSETRON 4 MG PO TBDP: 4.0000 mg | ORAL_TABLET | Freq: Three times a day (TID) | ORAL | 0 refills | Status: AC | PRN

## 2022-12-27 MED ORDER — PROCHLORPERAZINE EDISYLATE 10 MG/2ML IJ SOLN
10.0000 mg | Freq: Once | INTRAMUSCULAR | Status: AC
  Administered 2022-12-27: 10 mg via INTRAVENOUS
  Filled 2022-12-27: qty 2

## 2022-12-27 MED ORDER — DEXAMETHASONE SODIUM PHOSPHATE 10 MG/ML IJ SOLN
10.0000 mg | Freq: Once | INTRAMUSCULAR | Status: AC
  Administered 2022-12-27: 10 mg via INTRAVENOUS
  Filled 2022-12-27: qty 1

## 2022-12-27 NOTE — ED Provider Notes (Signed)
[place] EMERGENCY DEPARTMENT AT [facility] Provider Note   CSN: [id number] Arrival date & time: 12/27/22  0009     History  Chief Complaint  Patient presents with   Headache    Steven Diaz is a 27 y.o. male.  The history is provided by the patient.  Headache He has history of dysautonomia and comes in complaining of headache for the last 3 days.  Headache is bifrontal with radiation to the neck.  There is associated photophobia, nausea, vomiting.  He also notices pain is worse with exposure to odors.  He has had borderline elevated temperature as high as 100.2 with associated chills and sweats and feels generally weak.  He did go to an urgent care center where he had negative test for flu and COVID.  He has not had previous headaches like this.  He does note that it is very uncomfortable for him to lay down and certain positions seem more uncomfortable than others.  He denies weakness, numbness.   Home Medications Prior to Admission medications   Medication Sig Start Date End Date Taking? Authorizing Provider  doxycycline (VIBRAMYCIN) 100 MG capsule Take 1 capsule (100 mg total) by mouth 2 (two) times daily. 09/16/22   Small, Brooke L, PA  ibuprofen (ADVIL) 200 MG tablet Take 200 mg by mouth every 6 (six) hours as needed for moderate pain.    [provider]  meloxicam (MOBIC) 15 MG tablet Take 15 mg by mouth daily.    [provider]  tiZANidine (ZANAFLEX) 4 MG tablet Take 1 tablet (4 mg total) by mouth at bedtime. 04/06/22   [name], PA-C      Allergies    Propranolol, Perflutren, and Risperidone    Review of Systems   Review of Systems  Neurological:  Positive for headaches.  All other systems reviewed and are negative.   Physical Exam Updated Vital Signs BP (!) 146/80   Pulse (!) 109   Temp 99.1 F (37.3 C)   Resp 20   SpO2 100%  Physical Exam Vitals and nursing note reviewed.   27 year old male, resting comfortably and in  no acute distress. Vital signs are significant for elevated blood pressure and borderline elevated heart rate. Oxygen saturation is 100%, which is normal. Head is normocephalic and atraumatic. PERRLA, EOMI. Oropharynx is clear.  There is no tenderness to palpation over the temporalis muscles, but there is tenderness to palpation at the insertion of the paracervical muscles. Neck is nontender and supple. Lungs are clear without rales, wheezes, or rhonchi. Chest is nontender. Heart has regular rate and rhythm without murmur. Abdomen is soft, flat, nontender. Extremities have no cyanosis or edema, full range of motion is present. Skin is warm and dry without rash. Neurologic: Mental status is normal, cranial nerves are intact, strength is 5/5 in all 4 extremities, no sensory deficits identified.  ED Results / Procedures / Treatments   Labs (all labs ordered are listed, but only abnormal results are displayed) Labs Reviewed  COMPREHENSIVE METABOLIC PANEL - Abnormal; Notable for the following components:      Result Value   Sodium 134 (*)    Glucose, Bld 110 (*)    Calcium 8.8 (*)    AST 45 (*)    ALT 49 (*)    All other components within normal limits  CBC WITH DIFFERENTIAL/PLATELET - Abnormal; Notable for the following components:   MCH 25.9 (*)    All other components within normal limits  SARS CORONAVIRUS 2 BY RT PCR  URINALYSIS, W/ REFLEX TO CULTURE (INFECTION SUSPECTED)  I-STAT CG4 LACTIC ACID, ED  I-STAT CG4 LACTIC ACID, ED    EKG EKG Interpretation Date/Time:  Thursday December 27 2022 00:18:25 EST Ventricular Rate:  126 PR Interval:  175 QRS Duration:  100 QT Interval:  278 QTC Calculation: 403 R Axis:   135  Text Interpretation: Sinus tachycardia Right axis deviation Low voltage, precordial leads When compared with ECG of 08/20/2022, Rightward axis is now present Confirmed by Dione Booze ([id number]) on 12/27/2022 1:44:00 AM  Radiology DG Chest 2 View  Result Date:  12/27/2022 CLINICAL DATA:  Fever, neck pain, weakness EXAM: CHEST - 2 VIEW COMPARISON:  08/20/2022 FINDINGS: Heart and mediastinal contours are within normal limits. No focal opacities or effusions. No acute bony abnormality. IMPRESSION: No active cardiopulmonary disease. Electronically Signed   By: Charlett Nose M.D.   On: 12/27/2022 01:18    Procedures Procedures    Medications Ordered in ED Medications  sodium chloride 0.9 % bolus 1,000 mL (1,000 mLs Intravenous New Bag/Given 12/27/22 0451)  prochlorperazine (COMPAZINE) injection 10 mg (10 mg Intravenous Given 12/27/22 0450)  dexamethasone (DECADRON) injection 10 mg (10 mg Intravenous Given 12/27/22 0450)    ED Course/ Medical Decision Making/ A&P                                 Medical Decision Making Amount and/or Complexity of Data Reviewed Labs: ordered. Radiology: ordered.  Risk Prescription drug management.   Headache which may be migraine variant versus muscle contraction headache.  No red flags to suggest serious causes of headache such as meningitis, subarachnoid hemorrhage.  Low-grade fever and chills may indicate viral illness as a trigger.  He has also a nicotine user which could trigger migraines.  I reviewed his past records, and he has no relevant past visits.  I have ordered a migraine cocktail of normal saline solution, prochlorperazine, dexamethasone.  Chest x-ray shows no acute cardiopulmonary process.  Have independently viewed the images, and agree with the radiologist's interpretation.  I have reviewed his electrocardiogram and my interpretation is sinus tachycardia and low voltage.  I have reviewed his laboratory test, and my interpretation is elevated random glucose which had been present previously, mild hyponatremia which is not felt to be clinically significant, minimal elevation of transaminases which is also not felt to be clinically significant, normal urinalysis, negative PCR test for COVID-19.  He feels  much better following above-noted treatment.  I am discharging him with a prescription for ondansetron oral dissolving tablet, told to use over-the-counter NSAIDs and acetaminophen as needed for pain, also use ice application if headache persists.  Follow-up with PCP as needed.  Final Clinical Impression(s) / ED Diagnoses Final diagnoses:  Bad headache  Hyponatremia  Elevated random blood glucose level    Rx / DC Orders ED Discharge Orders          Ordered    ondansetron (ZOFRAN-ODT) 4 MG disintegrating tablet  Every 8 hours PRN        12/27/22 0559              Dione Booze, MD 12/27/22 0602

## 2022-12-27 NOTE — Discharge Instructions (Addendum)
If your headache comes back, try applying ice.    In addition, you may take over-the-counter ibuprofen or naproxen and you may take over-the-counter acetaminophen.  You may also combine acetaminophen with either ibuprofen or naproxen.  The combination of acetaminophen with either ibuprofen or naproxen gives you better pain relief and either medication by itself.    Return to the emergency department for any new or concerning symptoms.

## 2022-12-27 NOTE — ED Triage Notes (Signed)
Pt endorses 4 days of migraine headache, posterior neck pain, visual "flashes", light sensitivity,  lower back pain, hot flashes. Fevers. Covid and flu negative on Tuesday when seen at [facility].

## 2022-12-30 ENCOUNTER — Emergency Department (HOSPITAL_COMMUNITY)
Admission: EM | Admit: 2022-12-30 | Discharge: 2022-12-30 | Disposition: A | Discharge status: Home / Self Care | Payer: Medicaid Other | Attending: Emergency Medicine | Admitting: Emergency Medicine

## 2022-12-30 ENCOUNTER — Encounter (HOSPITAL_COMMUNITY): Payer: Self-pay

## 2022-12-30 ENCOUNTER — Other Ambulatory Visit: Payer: Self-pay

## 2022-12-30 DIAGNOSIS — R112 Nausea with vomiting, unspecified: Secondary | ICD-10-CM | POA: Insufficient documentation

## 2022-12-30 DIAGNOSIS — Z7982 Long term (current) use of aspirin: Secondary | ICD-10-CM | POA: Diagnosis not present

## 2022-12-30 DIAGNOSIS — I10 Essential (primary) hypertension: Secondary | ICD-10-CM | POA: Insufficient documentation

## 2022-12-30 DIAGNOSIS — G43909 Migraine, unspecified, not intractable, without status migrainosus: Secondary | ICD-10-CM | POA: Insufficient documentation

## 2022-12-30 DIAGNOSIS — Z79899 Other long term (current) drug therapy: Secondary | ICD-10-CM | POA: Diagnosis not present

## 2022-12-30 DIAGNOSIS — R519 Headache, unspecified: Secondary | ICD-10-CM

## 2022-12-30 DIAGNOSIS — L663 Perifolliculitis capitis abscedens: Secondary | ICD-10-CM | POA: Insufficient documentation

## 2022-12-30 DIAGNOSIS — L739 Follicular disorder, unspecified: Secondary | ICD-10-CM

## 2022-12-30 MED ORDER — DIPHENHYDRAMINE HCL 50 MG/ML IJ SOLN
50.0000 mg | Freq: Once | INTRAMUSCULAR | Status: DC
  Filled 2022-12-30: qty 1

## 2022-12-30 MED ORDER — DOXYCYCLINE HYCLATE 100 MG PO CAPS: 100.0000 mg | ORAL_CAPSULE | Freq: Two times a day (BID) | ORAL | 0 refills | Status: DC

## 2022-12-30 MED ORDER — METOCLOPRAMIDE HCL 5 MG/ML IJ SOLN
10.0000 mg | Freq: Once | INTRAMUSCULAR | Status: AC
  Administered 2022-12-30: 10 mg via INTRAVENOUS
  Filled 2022-12-30 (×2): qty 2

## 2022-12-30 MED ORDER — DEXAMETHASONE SODIUM PHOSPHATE 4 MG/ML IJ SOLN
4.0000 mg | Freq: Once | INTRAMUSCULAR | Status: AC
  Administered 2022-12-30: 4 mg via INTRAVENOUS
  Filled 2022-12-30 (×2): qty 1

## 2022-12-30 MED ORDER — KETOROLAC TROMETHAMINE 15 MG/ML IJ SOLN
15.0000 mg | Freq: Once | INTRAMUSCULAR | Status: AC
  Administered 2022-12-30: 15 mg via INTRAVENOUS
  Filled 2022-12-30 (×2): qty 1

## 2022-12-30 MED ORDER — SODIUM CHLORIDE 0.9 % IV BOLUS
1000.0000 mL | Freq: Once | INTRAVENOUS | Status: AC
  Administered 2022-12-30: 1000 mL via INTRAVENOUS

## 2022-12-30 NOTE — ED Provider Notes (Signed)
[place] EMERGENCY DEPARTMENT AT [facility] Provider Note   CSN: [id number] Arrival date & time: 12/30/22  7829     History  Chief Complaint  Patient presents with   Migraine    Angelia Mordhorst is a 27 y.o. male with past medical history of migraines, bipolar 1, hypertension, PTSD presenting to emergency room for a migraine.  Patient reports she has had migraine for 1 day associated with generalized bodyaches.    Patient has associated nausea and vomiting.  Patient currently following with neurology.  Patient reports that his headache started gradually yesterday morning and gradually got worse throughout the day.  Patient has not taken any medication today.  Patient reports feels like typical headache in the past.  Patient reports he has history of paresthesias as well as chronic low back pain and bilateral leg swelling however has not noticed acute change in any of the symptoms. Reports nausea, no vomiting today. Denies any focal weakness, abnormal vision, URI like symptoms or abdominal pain. Denies IVDU, etoh abuse.      Migraine Associated symptoms include headaches.       Home Medications Prior to Admission medications   Medication Sig Start Date End Date Taking? Authorizing Provider  aspirin-acetaminophen-caffeine (EXCEDRIN MIGRAINE) [phone] MG tablet Take 1 tablet by mouth every 6 (six) hours as needed for headache.    [provider]  ibuprofen (ADVIL) 200 MG tablet Take 200 mg by mouth every 6 (six) hours as needed for moderate pain.    [provider]  meloxicam (MOBIC) 15 MG tablet Take 15 mg by mouth daily as needed for pain.    [provider]  ondansetron (ZOFRAN-ODT) 4 MG disintegrating tablet Take 1 tablet (4 mg total) by mouth every 8 (eight) hours as needed for nausea or vomiting. 12/27/22   Dione Booze, MD      Allergies    Propranolol, Perflutren, and Risperidone    Review of Systems   Review of Systems   Neurological:  Positive for headaches.    Physical Exam Updated Vital Signs BP (!) 147/109 (BP Location: Left Arm)   Pulse (!) 122   Temp 98.1 F (36.7 C) (Oral)   Resp 20   Ht 6\' 3"  (1.905 m)   Wt (!) 190.5 kg   SpO2 98%   BMI 52.49 kg/m  Physical Exam Vitals and nursing note reviewed.  Constitutional:      General: He is not in acute distress.    Appearance: He is not toxic-appearing.  HENT:     Head: Normocephalic and atraumatic.  Eyes:     General: No scleral icterus.    Extraocular Movements: Extraocular movements intact.     Conjunctiva/sclera: Conjunctivae normal.     Pupils: Pupils are equal, round, and reactive to light.     Comments: No nystagmus.  Patient reporting sensitivity to light.   Cardiovascular:     Rate and Rhythm: Normal rate and regular rhythm.     Pulses: Normal pulses.     Heart sounds: Normal heart sounds.  Pulmonary:     Effort: Pulmonary effort is normal. No respiratory distress.     Breath sounds: Normal breath sounds.  Abdominal:     General: Abdomen is flat. Bowel sounds are normal.     Palpations: Abdomen is soft.     Tenderness: There is no abdominal tenderness.  Musculoskeletal:     Right lower leg: Edema present.     Left lower leg: Edema present.  Comments: Mild BLE edema, no pitting -neurovascularly intact  Skin:    General: Skin is warm and dry.     Findings: No lesion.     Comments: Patient has mild folliculitis of bilateral lower extremity.  No obvious cellulitis or abscess on exam.  Neurological:     General: No focal deficit present.     Mental Status: He is alert and oriented to person, place, and time. Mental status is at baseline.     Cranial Nerves: No cranial nerve deficit.     Sensory: No sensory deficit.     Motor: No weakness.     Coordination: Coordination normal.     Gait: Gait normal.  Psychiatric:        Mood and Affect: Mood normal.        Behavior: Behavior normal.        Thought Content: Thought  content normal.     ED Results / Procedures / Treatments   Labs (all labs ordered are listed, but only abnormal results are displayed) Labs Reviewed - No data to display  EKG None  Radiology No results found.  Procedures Procedures    Medications Ordered in ED Medications - No data to display  ED Course/ Medical Decision Making/ A&P                                 Medical Decision Making Risk Prescription drug management.   Azel Benedetti 27 y.o. presented today for HA. Working Ddx: tension headache, migraine, intracranial mass, intracranial hemorrhage, intracranial infection including meningitis vs encephalitis, trigeminal neuralgia, AVM, sinusitis, cerebral aneurysm, muscular headache, cavernous sinus thrombosis, carotid artery dissection.  R/o DDx: intracranial mass, hemorrhage,or infection including meningitis vs encephalitis, trigeminal neuralgia, AVM, cerebral aneurysm, muscular headache, cavernous sinus thrombosis, carotid artery dissection are less likely due to history of present illness, physical exam, labs/imaging findings  PMHX: migraines, bipolar 1, hypertension, PTSD  Review of prior external notes: None  Unique Tests and My Interpretation:  Labs and imaging were considered however since patient feels this is typical headache pattern and the fact that headache began gradually and has gradually worsened and not associated with focal deficits, I do not feel that imaging is necessary at this time  Problem List / ED Course / Critical interventions / Medication management  Patient reporting to emergency room with bad headache.  Patient had a contrast headache.  Patient has been no focal neurological deficits on exam.  Answering questions appropriately hemodynamically stable.  After migraine cocktail patient reports immediate resolution of symptoms requesting discharge.  Patient also has folliculitis of the lower extremities discussed following up with  dermatology for further evaluation and outpatient management.  I ordered medication including Benadryl, Toradol, dexamethasone, Reglan Reevaluation of the patient after these medicines showed that the patient improved Patients vitals assessed. Upon arrival patient is hemodynamically stable.  I have reviewed the patients home medicines and have made adjustments as needed    Plan:   F/u w/ PCP in 2-3d to ensure resolution of sx.  Patient was given return precautions. Patient stable for discharge at this time.  Patient educated on sx/dx and verbalized understanding of plan. Return to ED if new or worsening sx.           Final Clinical Impression(s) / ED Diagnoses Final diagnoses:  Bad headache  Folliculitis    Rx / DC Orders ED Discharge Orders     None  Reinaldo Raddle 12/30/22 1743    Arby Barrette, MD 01/02/23 2234

## 2022-12-30 NOTE — ED Triage Notes (Signed)
Patient here for evaluation of migraines. Reports being seen for the same a few days ago, but yesterday his migraine "came back with a vengeance." Also reports swelling in bilateral legs, but he worked yesterday and was on his feet all day. Pt has been having nausea and vomiting. Reports tylenol, aleeve, ibuprofen has not worked.

## 2022-12-30 NOTE — Discharge Instructions (Addendum)
You are seen in the emergency room today for bad headache.  Your symptoms have resolved after treatment.  You also found to have folliculitis of your lower legs, I have sent doxycycline which is an antibiotic to your pharmacy please take as prescribed.  Make sure you take this medicine with food.  Please follow-up with primary care to ensure resolution of symptoms and return to emergency room if you have any new or worsening symptoms.  We follow-up with primary care you can discuss prophylactic migraine medication or abortive migraine medications.  I have also attached [facility] as discussed for follow-up for folliculitis.

## 2023-01-10 ENCOUNTER — Ambulatory Visit
Admission: RE | Admit: 2023-01-10 | Discharge: 2023-01-10 | Disposition: A | Discharge status: Home / Self Care | Payer: Medicaid Other | Source: Ambulatory Visit | Attending: Emergency Medicine | Admitting: Emergency Medicine

## 2023-01-10 VITALS — BP 125/89 | HR 115 | Temp 98.4°F | Resp 20 | Ht 75.0 in | Wt >= 6400 oz

## 2023-01-10 DIAGNOSIS — J019 Acute sinusitis, unspecified: Secondary | ICD-10-CM

## 2023-01-10 MED ORDER — PREDNISONE 10 MG (21) PO TBPK
ORAL_TABLET | Freq: Every day | ORAL | 0 refills | Status: AC
Start: 2023-01-10 — End: ?

## 2023-01-10 MED ORDER — AMOXICILLIN-POT CLAVULANATE 875-125 MG PO TABS: 1.0000 | ORAL_TABLET | Freq: Two times a day (BID) | ORAL | 0 refills | Status: AC

## 2023-01-10 NOTE — Discharge Instructions (Addendum)
Take medications as prescribed. Can also take OTC medicine. Rest and keep hydrated. Return to care if no improvement after completing antibiotics. Go to the ER if develop difficulty breathing.

## 2023-01-10 NOTE — ED Triage Notes (Signed)
Patient reports his lungs feel full of junk, migraines, sore throat, nausea, "feels like I have acid come out my butt when I poop." Symptoms x 1 month. Treated with Excedrin, sinus and cold medicine.  Patient also reports mid and low back pain that has been present since it has gotten cold. States meloxicam and flexeril is not helping.

## 2023-01-10 NOTE — ED Provider Notes (Signed)
[name]    CSN: [id number] Arrival date & time: 01/10/23  1751      History   Chief Complaint Chief Complaint  Patient presents with   Sore Throat    Been feeling crappy for about 2 weeks. - Entered by patient    HPI Steven Diaz is a 27 y.o. male.   Patient presents with concerns of feeling unwell for about a month. He reports headaches and migraines, nasal congestion, sore throat, and productive cough. He has been taking OTC medicine. He was seen a couple weeks ago in the ER and had negative testing. He was prescribed doxy for folliculitis but did not fill it. He denies difficulty breathing.   The history is provided by the patient.  Sore Throat Associated symptoms include headaches. Pertinent negatives include no shortness of breath.    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 No pertinent surgical history.     Home Medications    Prior to Admission medications   Medication Sig Start Date End Date Taking? Authorizing Provider  amoxicillin-clavulanate (AUGMENTIN) 875-125 MG tablet Take 1 tablet by mouth every 12 (twelve) hours. 01/10/23  Yes Aerith Canal L, PA  predniSONE (STERAPRED UNI-PAK 21 TAB) 10 MG (21) TBPK tablet Take by mouth daily. Take 6 tabs by mouth daily  for 2 days, then 5 tabs for 2 days, then 4 tabs for 2 days, then 3 tabs for 2 days, 2 tabs for 2 days, then 1 tab by mouth daily for 2 days 01/10/23  Yes Vallery Sa, Reanna Scoggin L, PA  aspirin-acetaminophen-caffeine (EXCEDRIN MIGRAINE) [phone] MG tablet Take 1 tablet by mouth every 6 (six) hours as needed for headache.    [provider]  ibuprofen (ADVIL) 200 MG tablet Take 200 mg by mouth  every 6 (six) hours as needed for moderate pain.    [provider]  meloxicam (MOBIC) 15 MG tablet Take 15 mg by mouth daily as needed for pain.    [provider]  ondansetron (ZOFRAN-ODT) 4 MG disintegrating tablet Take 1 tablet (4 mg total) by mouth every 8 (eight) hours as needed for nausea or vomiting. 12/27/22   Dione Booze, MD    Family History Family History  Problem Relation Age of Onset   COPD Mother    Heart disease Mother    Cancer Mother    Heart disease Father    Diabetes Maternal Grandmother    Diabetes Maternal Grandfather    Diabetes Paternal Grandmother    Diabetes Paternal Grandfather     Social History Social History   Tobacco Use   Smoking status: Former    Current packs/day: 0.00    Average packs/day: 1 pack/day for 15.0 years (15.0 ttl pk-yrs)    Types: Cigarettes    Start date: 02/11/2005    Quit date: 02/12/2020    Years since quitting: 2.9   Smokeless tobacco: Never  Vaping Use   Vaping status: Every Day   Start date: 02/06/2020   Substances: Nicotine, Flavoring  Substance Use Topics   Alcohol use: Not Currently   Drug use: Never     Allergies   Propranolol, Perflutren, and Risperidone  Review of Systems Review of Systems  Constitutional:  Positive for fatigue. Negative for fever.  HENT:  Positive for congestion and sore throat. Negative for ear pain.   Respiratory:  Positive for cough. Negative for shortness of breath.   Gastrointestinal:  Negative for diarrhea and vomiting.  Musculoskeletal:  Negative for myalgias.  Skin:  Negative for rash.  Neurological:  Positive for headaches. Negative for dizziness.     Physical Exam Triage Vital Signs ED Triage Vitals  Encounter Vitals Group     BP 01/10/23 1849 125/89     Systolic BP Percentile --      Diastolic BP Percentile --      Pulse Rate 01/10/23 1849 (!) 115     Resp 01/10/23 1849 20     Temp 01/10/23 1849 98.4 F (36.9 C)     Temp Source 01/10/23 1849 Oral      SpO2 01/10/23 1849 96 %     Weight 01/10/23 1848 (!) 400 lb (181.4 kg)     Height 01/10/23 1848 6\' 3"  (1.905 m)     Head Circumference --      Peak Flow --      Pain Score 01/10/23 1848 8     Pain Loc --      Pain Education --      Exclude from Growth Chart --    No data found.  Updated Vital Signs BP 125/89 (BP Location: Left Arm)   Pulse (!) 115   Temp 98.4 F (36.9 C) (Oral)   Resp 20   Ht 6\' 3"  (1.905 m)   Wt (!) 400 lb (181.4 kg)   SpO2 96%   BMI 50.00 kg/m   Visual Acuity Right Eye Distance:   Left Eye Distance:   Bilateral Distance:    Right Eye Near:   Left Eye Near:    Bilateral Near:     Physical Exam Vitals and nursing note reviewed.  Constitutional:      General: He is not in acute distress. HENT:     Head: Normocephalic and atraumatic.     Right Ear: Tympanic membrane, ear canal and external ear normal.     Left Ear: Tympanic membrane, ear canal and external ear normal.     Nose: Congestion present. No rhinorrhea.     Mouth/Throat:     Mouth: Mucous membranes are moist.     Pharynx: Oropharynx is clear. No oropharyngeal exudate or posterior oropharyngeal erythema.  Eyes:     Conjunctiva/sclera: Conjunctivae normal.     Pupils: Pupils are equal, round, and reactive to light.  Cardiovascular:     Rate and Rhythm: Normal rate and regular rhythm.     Heart sounds: Normal heart sounds.  Pulmonary:     Effort: Pulmonary effort is normal.     Breath sounds: Normal breath sounds. No wheezing, rhonchi or rales.  Musculoskeletal:     Cervical back: Normal range of motion.  Lymphadenopathy:     Cervical: No cervical adenopathy.  Skin:    Findings: No rash.  Neurological:     Mental Status: He is alert.  Psychiatric:        Mood and Affect: Mood normal.      UC Treatments / Results  Labs (all labs ordered are listed, but only abnormal results are displayed) Labs Reviewed - No data to display  EKG   Radiology No results  found.  Procedures Procedures (including critical care time)  Medications Ordered in UC Medications - No  data to display  Initial Impression / Assessment and Plan / UC Course  I have reviewed the triage vital signs and the nursing notes.  Pertinent labs & imaging results that were available during my care of the patient were reviewed by me and considered in my medical decision making (see chart for details).     Congestion with drainage and cough x4wk, empiric tx for sinusitis. Also Rx steroid taper due to cough and reports recent flare of pre-existing arthritis due to cold weather and sickness. Discussed return precautions.  E/M: 1 acute uncomplicated illness, no data, moderate risk due to prescription management  Final Clinical Impressions(s) / UC Diagnoses   Final diagnoses:  Acute sinusitis, recurrence not specified, unspecified location     Discharge Instructions      Take medications as prescribed. Can also take OTC medicine. Rest and keep hydrated. Return to care if no improvement after completing antibiotics. Go to the ER if develop difficulty breathing.     ED Prescriptions     Medication Sig Dispense Auth. Provider   amoxicillin-clavulanate (AUGMENTIN) 875-125 MG tablet Take 1 tablet by mouth every 12 (twelve) hours. 14 tablet Armina Galloway L, PA   predniSONE (STERAPRED UNI-PAK 21 TAB) 10 MG (21) TBPK tablet Take by mouth daily. Take 6 tabs by mouth daily  for 2 days, then 5 tabs for 2 days, then 4 tabs for 2 days, then 3 tabs for 2 days, 2 tabs for 2 days, then 1 tab by mouth daily for 2 days 42 tablet Vallery Sa, Katurah Karapetian L, PA      PDMP not reviewed this encounter.   Estanislado Pandy, [place] 01/10/23 [phone]
